# Patient Record
Sex: Female | Born: 1990 | Race: White | Hispanic: No | Smoking: Former smoker
Health system: Southern US, Community
[De-identification: ages and names within clinical notes are randomized; demographics above are authoritative.]

## PROBLEM LIST (undated history)

## (undated) DIAGNOSIS — J45909 Unspecified asthma, uncomplicated: Secondary | ICD-10-CM

## (undated) DIAGNOSIS — R87619 Unspecified abnormal cytological findings in specimens from cervix uteri: Secondary | ICD-10-CM

## (undated) DIAGNOSIS — IMO0002 Reserved for concepts with insufficient information to code with codable children: Secondary | ICD-10-CM

## (undated) DIAGNOSIS — R87629 Unspecified abnormal cytological findings in specimens from vagina: Secondary | ICD-10-CM

## (undated) DIAGNOSIS — B9689 Other specified bacterial agents as the cause of diseases classified elsewhere: Secondary | ICD-10-CM

## (undated) DIAGNOSIS — Z309 Encounter for contraceptive management, unspecified: Secondary | ICD-10-CM

## (undated) DIAGNOSIS — B009 Herpesviral infection, unspecified: Secondary | ICD-10-CM

## (undated) DIAGNOSIS — N760 Acute vaginitis: Secondary | ICD-10-CM

## (undated) HISTORY — DX: Acute vaginitis: N76.0

## (undated) HISTORY — DX: Reserved for concepts with insufficient information to code with codable children: IMO0002

## (undated) HISTORY — DX: Herpesviral infection, unspecified: B00.9

## (undated) HISTORY — DX: Unspecified abnormal cytological findings in specimens from vagina: R87.629

## (undated) HISTORY — DX: Unspecified abnormal cytological findings in specimens from cervix uteri: R87.619

## (undated) HISTORY — DX: Other specified bacterial agents as the cause of diseases classified elsewhere: B96.89

## (undated) HISTORY — DX: Encounter for contraceptive management, unspecified: Z30.9

---
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
## 2009-08-26 ENCOUNTER — Emergency Department (HOSPITAL_COMMUNITY): Admission: EM | Admit: 2009-08-26 | Discharge: 2009-08-26 | Payer: Self-pay | Admitting: Emergency Medicine

## 2010-04-23 LAB — BASIC METABOLIC PANEL
BUN: 14 mg/dL (ref 6–23)
CO2: 22 mEq/L (ref 19–32)
Calcium: 9.4 mg/dL (ref 8.4–10.5)
Chloride: 105 mEq/L (ref 96–112)
Creatinine, Ser: 0.77 mg/dL (ref 0.4–1.2)
GFR calc Af Amer: >60 mL/min (ref >60)
GFR calc non Af Amer: >60 mL/min (ref >60)
Glucose, Bld: 98 mg/dL (ref 70–99)
Potassium: 3.6 mEq/L (ref 3.5–5.1)
Sodium: 137 mEq/L (ref 135–145)

## 2010-04-23 LAB — CBC
Platelets: 302 10*3/uL (ref 150–400)
RDW: 16.8 % — ABNORMAL HIGH (ref 11.5–15.5)
WBC: 11.6 10*3/uL — ABNORMAL HIGH (ref 4.0–10.5)

## 2010-04-23 LAB — DIFFERENTIAL
Basophils Absolute: 0 10*3/uL (ref 0.0–0.1)
Lymphocytes Relative: 17 % (ref 12–46)
Neutro Abs: 8.9 10*3/uL — ABNORMAL HIGH (ref 1.7–7.7)

## 2010-04-23 LAB — POCT CARDIAC MARKERS
CKMB, poc: <1 ng/mL — ABNORMAL LOW (ref 1.0–8.0)
Troponin i, poc: <0.05 ng/mL (ref 0.00–0.09)

## 2010-07-29 ENCOUNTER — Other Ambulatory Visit (HOSPITAL_COMMUNITY): Payer: Self-pay | Admitting: Internal Medicine

## 2010-07-29 ENCOUNTER — Ambulatory Visit (HOSPITAL_COMMUNITY)
Admission: RE | Admit: 2010-07-29 | Discharge: 2010-07-29 | Disposition: A | Discharge status: Home / Self Care | Payer: 59 | Source: Ambulatory Visit | Attending: Internal Medicine | Admitting: Internal Medicine

## 2010-07-29 DIAGNOSIS — R52 Pain, unspecified: Secondary | ICD-10-CM

## 2010-07-29 DIAGNOSIS — W19XXXA Unspecified fall, initial encounter: Secondary | ICD-10-CM | POA: Insufficient documentation

## 2010-07-29 DIAGNOSIS — M25549 Pain in joints of unspecified hand: Secondary | ICD-10-CM | POA: Insufficient documentation

## 2010-07-29 DIAGNOSIS — S62329A Displaced fracture of shaft of unspecified metacarpal bone, initial encounter for closed fracture: Secondary | ICD-10-CM | POA: Insufficient documentation

## 2011-12-20 ENCOUNTER — Other Ambulatory Visit (HOSPITAL_COMMUNITY)
Admission: RE | Admit: 2011-12-20 | Discharge: 2011-12-20 | Disposition: A | Discharge status: Home / Self Care | Payer: 59 | Source: Ambulatory Visit | Attending: Obstetrics and Gynecology | Admitting: Obstetrics and Gynecology

## 2011-12-20 DIAGNOSIS — Z01419 Encounter for gynecological examination (general) (routine) without abnormal findings: Secondary | ICD-10-CM | POA: Insufficient documentation

## 2012-05-01 ENCOUNTER — Ambulatory Visit (INDEPENDENT_AMBULATORY_CARE_PROVIDER_SITE_OTHER): Payer: 59 | Admitting: Obstetrics & Gynecology

## 2012-05-01 ENCOUNTER — Encounter: Payer: Self-pay | Admitting: Obstetrics & Gynecology

## 2012-05-01 VITALS — BP 120/80 | Ht 62.0 in | Wt 159.0 lb

## 2012-05-01 DIAGNOSIS — N6019 Diffuse cystic mastopathy of unspecified breast: Secondary | ICD-10-CM

## 2012-05-01 DIAGNOSIS — N6011 Diffuse cystic mastopathy of right breast: Secondary | ICD-10-CM

## 2012-05-01 NOTE — Patient Instructions (Signed)
Fibrocystic Breast Changes  Fibrocystic breast changes is a non-cancerous(benign) condition that about half of all women have at some time in their life. It is also called benign breast disease and mammary dysplasia. It may also be called fibrocystic breast disease, but it is not really a disease. It is a common condition that occurs when women go through the hormonal changes during their menstrual cycle, between the ages of 20 to 50. Menopausal women do not have this problem, unless they are on hormone therapy. It can affect one or both breasts. This is not a sign that you will later get cancer.  CAUSES   Overgrowth of cells lining the milk ducts, or enlarged lobules in the breast, cause the breast duct to become blocked. The duct then fills up with fluid. This is like a small balloon filled with water. It is called a cyst. Over time, with repeated inflammation there is a tendency to form scar tissue. This scar tissue becomes the fibrous part of fibrocystic disease. The exact cause of this happening is not known, but it may be related to the female hormones, estrogen and progesterone. Heredity (genetics) may also be a factor in some cases.  SYMPTOMS   · Tenderness.  · Swelling.  · Rope-like feeling.  · Lumpy breast, one or both sides.  · Changes in the size of the breasts, before and after the menstrual period (larger before, smaller after).  · Green or dark brown nipple discharge (not blood).  Symptoms are usually worse before periods (menstrual cycle) and get better toward the end of menstruation. Usually, it is temporary minor discomfort. But some women have severe pain.   DIAGNOSIS   Check your breasts monthly. The best time to check your breasts is after your period. If you check them during your period, you are more likely to feel the normal glands enlarged, as a result of the hormonal changes that happen right before your period. If you do not have menstrual periods, check your breasts the first day of every  month. Become familiar with the way your own breasts feel. It is then easier to notice if there are changes, such as more tenderness, a new growth, change in breast size, or a change in a lump that has always been there. All breasts lumps need to be investigated, to rule out breast cancer. See your caregiver as soon as possible, if you find a lump. Most breast lumps are not cancerous. Excellent treatment is available for ones that are.   To make a diagnosis, your caregiver will examine your breasts and may recommend other tests, such as:  · Mammogram (breast X-ray).  · Ultrasound.  · MRI (magnetic resonance imaging).  · Removing fluid from the cyst with a fine needle, under local anesthesia (aspiration).  · Taking a breast tissue sample (breast biopsy).  Some questions your caregiver will ask are:  · What was the date of your last period?  · When did the lump show up?  · Is there any discharge from your breast?  · Is the breast tender or painful?  · Are the symptoms in one or both breasts?  · Has the lump changed in size from month-to-month? How long has it been present?  · Any family history of breast problems?  · Any past breast problems?  · Any history of breast surgery?  · Are you taking any medications?  · When was your last mammogram, and where was it done?  TREATMENT   ·   Dietary changes help to prevent or reduce the symptoms of fibrocystic breast changes.  · You may need to stop consuming all foods that contain caffeine, such as chocolate, sodas, coffee, and tea.  · Reducing sugar and fat in your diet may also help.  · Decrease estrogen in your diet. Some sources include commercially raised meats which contain estrogen. Eliminate other natural estrogens.  · Birth control pills can also make symptoms worse.  · Natural progesterone cream, applied at a dose of 15 to 20 milligrams per day, from ovulation until a day or two before your period returns, may help with returning to normal breast tissue over several  months. Seek advice from your caregiver.  · Over-the-counter pain pills may help, as recommended by your caregiver.  · Danazol hormone (female-like hormone) is sometimes used. It may cause hair growth and acne.  · Needle aspiration can be used, to remove fluid from the cyst.  · Surgery may be needed, to remove a large, persistent, and tender cyst.  · Evening primrose oil may help with the tenderness and pain. It has linolenic acid that women may not have enough of.  HOME CARE INSTRUCTIONS   · Examine your breasts after every menstrual period.  · If you do not have menstrual periods, examine your breasts the first day of every month.  · Wear a firm support bra, especially when exercising.  · Decrease or avoid caffeine in your diet.  · Decrease the fat and sugar in your diet.  · Eat a balanced diet.  · Try to see your caregiver after you have a menstrual period.  · Before seeing your caregiver, make notes about:  · When you have the symptoms.  · What types of symptoms you are having.  · Medications you are taking.  · When and where your last mammogram was taken.  · Past breast problems or breast surgery.  SEEK MEDICAL CARE IF:   · You have been diagnosed with fibrocystic breast changes, and you develop changes in your breast:  · Discharge from the nipple, especially bloody discharge.  · Pain in the breast that does not go away after your menstrual period.  · New lumps or bumps in the breast.  · Lumps in your armpit.  · Your breast or breasts become enlarged, red, and painful.  · You find an isolated lump, even if it is not tender.  · You have questions about this condition that have not been answered.  Document Released: 11/09/2005 Document Revised: 04/17/2011 Document Reviewed: 02/03/2009  ExitCare® Patient Information ©2013 [employer].

## 2012-05-01 NOTE — Progress Notes (Signed)
Patient ID: Laura Rosario, female   DOB: 01/24/1991, 22 y.o.   MRN: [id number]  Subjective:     Laura Rosario is an 22 y.o. female who presents for evaluation of a breast mass. Change was noted 1 month ago, and has been stable since first identified. Patient does routinely do self breast exams. The mass does not change during menstrual cycle. The mass is not tender. Patient denies nipple discharge. Breast cancer risk factors include: none.  The following portions of the patient's history were reviewed and updated as appropriate: allergies, current medications, past family history, past medical history, past social history, past surgical history and problem list.  Review of Systems Pertinent items are noted in HPI.     Objective:     Left breast is normal Right breast has area of fibrocystic changes 2 x 2 cm just above the right nipple, not fibroadenoma or cyst      Assessment:    fibrocystic changes    Plan:    Arranged for ultrasound.  Follow up in 1 month

## 2012-05-03 ENCOUNTER — Ambulatory Visit: Payer: Self-pay | Admitting: Obstetrics and Gynecology

## 2012-05-08 ENCOUNTER — Ambulatory Visit (HOSPITAL_COMMUNITY)
Admission: RE | Admit: 2012-05-08 | Discharge: 2012-05-08 | Disposition: A | Discharge status: Home / Self Care | Payer: 59 | Source: Ambulatory Visit | Attending: Obstetrics & Gynecology | Admitting: Obstetrics & Gynecology

## 2012-05-08 DIAGNOSIS — N6011 Diffuse cystic mastopathy of right breast: Secondary | ICD-10-CM

## 2012-05-08 DIAGNOSIS — N63 Unspecified lump in unspecified breast: Secondary | ICD-10-CM | POA: Insufficient documentation

## 2013-02-14 ENCOUNTER — Other Ambulatory Visit (HOSPITAL_COMMUNITY)
Admission: RE | Admit: 2013-02-14 | Discharge: 2013-02-14 | Disposition: A | Discharge status: Home / Self Care | Payer: 59 | Source: Ambulatory Visit | Attending: Obstetrics and Gynecology | Admitting: Obstetrics and Gynecology

## 2013-02-14 ENCOUNTER — Encounter (INDEPENDENT_AMBULATORY_CARE_PROVIDER_SITE_OTHER): Payer: Self-pay

## 2013-02-14 ENCOUNTER — Encounter: Payer: Self-pay | Admitting: Obstetrics and Gynecology

## 2013-02-14 ENCOUNTER — Ambulatory Visit (INDEPENDENT_AMBULATORY_CARE_PROVIDER_SITE_OTHER): Payer: 59 | Admitting: Obstetrics and Gynecology

## 2013-02-14 VITALS — BP 110/70 | Ht 62.0 in | Wt 165.0 lb

## 2013-02-14 DIAGNOSIS — Z1151 Encounter for screening for human papillomavirus (HPV): Secondary | ICD-10-CM | POA: Insufficient documentation

## 2013-02-14 DIAGNOSIS — Z01419 Encounter for gynecological examination (general) (routine) without abnormal findings: Secondary | ICD-10-CM

## 2013-02-14 NOTE — Addendum Note (Signed)
Addended by: Richardson ChiquitoRAVIS, Aranza Geddes M on: 02/14/2013 10:08 AM   Modules accepted: Orders

## 2013-02-14 NOTE — Progress Notes (Signed)
Patient ID: Laura Rosario, female   DOB: Oct 11, 1990, 23 y.o.   MRN: [id number] Pt here today for annual exam. Pt states that she has had painful intercourse the past few times and had some terrible cramping a few days ago.    Assessment:  Annual Gyn Exam Dyspareunia due to ut contact   Plan:  1. pap smear done, next pap due 2185yr 2. return annually or prn 3    Annual mammogram at 38 Subjective:  Laura Rosario is a 23 y.o. female G0P0 who presents for annual exam. Patient's last menstrual period was 01/30/2013. The patient has complaints today of none x dyspareunia. Hx HSV-II uses condoms, considering other contr management options  The following portions of the patient's history were reviewed and updated as appropriate: allergies, current medications, past family history, past medical history, past social history, past surgical history and problem list. Pt is ADOPTED Review of Systems Constitutional: negative Gastrointestinal: negative Genitourinary: reg cycles, see hpi  Objective:  BP 110/70  Ht 5\' 2"  (1.575 m)  Wt 165 lb (74.844 kg)  BMI 30.17 kg/m2  LMP 01/30/2013   BMI: Body mass index is 30.17 kg/(m^2).  General Appearance: Alert, appropriate appearance for age. No acute distress HEENT: Grossly normal Neck / Thyroid:  Cardiovascular: RRR; normal S1, S2, no murmur Lungs: CTA bilaterally Back: No CVAT Breast Exam: No dimpling, nipple retraction or discharge. No masses or nodes., Normal to inspection, Normal breast tissue bilaterally,  No masses or nodes.No dimpling, nipple retraction or discharge. Gastrointestinal: Soft, non-tender, no masses or organomegaly Pelvic Exam: Vulva and vagina appear normal. Bimanual exam reveals normal uterus and adnexa. Clinical staff offered to be present for exam: yes  Initials: AMT Rectovaginal: not indicated Lymphatic Exam: Non-palpable nodes in neck, clavicular, axillary, or inguinal regions Skin: no rash or abnormalities Neurologic:  Normal gait and speech, no tremor  Psychiatric: Alert and oriented, appropriate affect.  Urinalysis:Not done  Christin BachJohn Ferguson. MD Pgr [phone] 9:27 AM

## 2013-04-23 ENCOUNTER — Ambulatory Visit (INDEPENDENT_AMBULATORY_CARE_PROVIDER_SITE_OTHER): Payer: 59 | Admitting: Obstetrics & Gynecology

## 2013-04-23 ENCOUNTER — Encounter: Payer: Self-pay | Admitting: Obstetrics & Gynecology

## 2013-04-23 ENCOUNTER — Encounter (INDEPENDENT_AMBULATORY_CARE_PROVIDER_SITE_OTHER): Payer: Self-pay

## 2013-04-23 VITALS — BP 124/72 | Ht 62.0 in | Wt 158.0 lb

## 2013-04-23 DIAGNOSIS — N760 Acute vaginitis: Secondary | ICD-10-CM

## 2013-04-23 MED ORDER — METRONIDAZOLE 0.75 % VA GEL: VAGINAL | Status: DC

## 2013-04-23 NOTE — Progress Notes (Signed)
Patient ID: Laura Rosario, female   DOB: Dec 13, 1990, 23 y.o.   MRN: [id number] Pt with odor and discharge never had before  Wet prep  +BV No trich or yeast  Reviewed cuses and control of BV  rx metrogel

## 2013-08-22 ENCOUNTER — Ambulatory Visit (INDEPENDENT_AMBULATORY_CARE_PROVIDER_SITE_OTHER): Payer: 59 | Admitting: Adult Health

## 2013-08-22 ENCOUNTER — Encounter: Payer: Self-pay | Admitting: Adult Health

## 2013-08-22 VITALS — BP 110/70 | Ht 62.0 in | Wt 160.0 lb

## 2013-08-22 DIAGNOSIS — Z30011 Encounter for initial prescription of contraceptive pills: Secondary | ICD-10-CM

## 2013-08-22 DIAGNOSIS — Z309 Encounter for contraceptive management, unspecified: Secondary | ICD-10-CM

## 2013-08-22 DIAGNOSIS — Z3202 Encounter for pregnancy test, result negative: Secondary | ICD-10-CM

## 2013-08-22 DIAGNOSIS — Z3009 Encounter for other general counseling and advice on contraception: Secondary | ICD-10-CM

## 2013-08-22 HISTORY — DX: Encounter for contraceptive management, unspecified: Z30.9

## 2013-08-22 LAB — POCT URINE PREGNANCY: PREG TEST UR: NEGATIVE

## 2013-08-22 MED ORDER — NORETHIN ACE-ETH ESTRAD-FE 1-20 MG-MCG(24) PO TABS: 1.0000 | ORAL_TABLET | Freq: Every day | ORAL | Status: DC

## 2013-08-22 NOTE — Progress Notes (Addendum)
Subjective:     Patient ID: Laura Rosario, female   DOB: 02/09/1990, 23 y.o.   MRN: [id number]  HPI Laura Rosario is a 23 year old white female in to get Rx for OCs, has been taking Loestrin 24 fe that she had at home, but they were expired.No complaints, had normal pap 02/2013.  Review of Systems See HPI Reviewed past medical,surgical, social and family history. Reviewed medications and allergies.     Objective:   Physical Exam BP 110/70  Ht 5\' 2"  (1.575 m)  Wt 160 lb (72.576 kg)  BMI 29.26 kg/m2  LMP 06/18/2015UPT negative, Skin warm and dry. Neck: mid line trachea, normal thyroid. Lungs: clear to ausculation bilaterally. Cardiovascular: regular rate and rhythm.    Assessment:     Contraceptive management    Plan:    Use condoms Rx Loestrin 24 fe disp 1 pack take 1 daily with 11 refills Follow up prn

## 2013-08-22 NOTE — Patient Instructions (Signed)
Take OCs daily Use condoms Oral Contraception Use Oral contraceptive pills (OCPs) are medicines taken to prevent pregnancy. OCPs work by preventing the ovaries from releasing eggs. The hormones in OCPs also cause the cervical mucus to thicken, preventing the sperm from entering the uterus. The hormones also cause the uterine lining to become thin, not allowing a fertilized egg to attach to the inside of the uterus. OCPs are highly effective when taken exactly as prescribed. However, OCPs do not prevent sexually transmitted diseases (STDs). Safe sex practices, such as using condoms along with an OCP, can help prevent STDs. Before taking OCPs, you may have a physical exam and Pap test. Your health care provider may also order blood tests if necessary. Your health care provider will make sure you are a good candidate for oral contraception. Discuss with your health care provider the possible side effects of the OCP you may be prescribed. When starting an OCP, it can take 2 to 3 months for the body to adjust to the changes in hormone levels in your body.  HOW TO TAKE ORAL CONTRACEPTIVE PILLS Your health care provider may advise you on how to start taking the first cycle of OCPs. Otherwise, you can:   Start on day 1 of your menstrual period. You will not need any backup contraceptive protection with this start time.   Start on the first Sunday after your menstrual period or the day you get your prescription. In these cases, you will need to use backup contraceptive protection for the first week.   Start the pill at any time of your cycle. If you take the pill within 5 days of the start of your period, you are protected against pregnancy right away. In this case, you will not need a backup form of birth control. If you start at any other time of your menstrual cycle, you will need to use another form of birth control for 7 days. If your OCP is the type called a minipill, it will protect you from pregnancy  after taking it for 2 days (48 hours). After you have started taking OCPs:   If you forget to take 1 pill, take it as soon as you remember. Take the next pill at the regular time.   If you miss 2 or more pills, call your health care provider because different pills have different instructions for missed doses. Use backup birth control until your next menstrual period starts.   If you use a 28-day pack that contains inactive pills and you miss 1 of the last 7 pills (pills with no hormones), it will not matter. Throw away the rest of the nonhormone pills and start a new pill pack.  No matter which day you start the OCP, you will always start a new pack on that same day of the week. Have an extra pack of OCPs and a backup contraceptive method available in case you miss some pills or lose your OCP pack.  HOME CARE INSTRUCTIONS   Do not smoke.   Always use a condom to protect against STDs. OCPs do not protect against STDs.   Use a calendar to mark your menstrual period days.   Read the information and directions that came with your OCP. Talk to your health care provider if you have questions.  SEEK MEDICAL CARE IF:   You develop nausea and vomiting.   You have abnormal vaginal discharge or bleeding.   You develop a rash.   You miss your menstrual period.  You are losing your hair.   You need treatment for mood swings or depression.   You get dizzy when taking the OCP.   You develop acne from taking the OCP.   You become pregnant.  SEEK IMMEDIATE MEDICAL CARE IF:   You develop chest pain.   You develop shortness of breath.   You have an uncontrolled or severe headache.   You develop numbness or slurred speech.   You develop visual problems.   You develop pain, redness, and swelling in the legs.  Document Released: 01/12/2011 Document Revised: 09/25/2012 Document Reviewed: 07/14/2012 [facility] Patient Information 2015 [employer], [place]. This information  is not intended to replace advice given to you by your health care provider. Make sure you discuss any questions you have with your health care provider.

## 2014-01-13 ENCOUNTER — Encounter: Payer: Self-pay | Admitting: *Deleted

## 2014-01-13 ENCOUNTER — Emergency Department
Admission: EM | Admit: 2014-01-13 | Discharge: 2014-01-13 | Disposition: A | Discharge status: Home / Self Care | Payer: 59 | Source: Home / Self Care

## 2014-01-13 DIAGNOSIS — J029 Acute pharyngitis, unspecified: Secondary | ICD-10-CM

## 2014-01-13 HISTORY — DX: Unspecified asthma, uncomplicated: J45.909

## 2014-01-13 LAB — POCT RAPID STREP A (OFFICE): RAPID STREP A SCREEN: NEGATIVE

## 2014-01-13 MED ORDER — AMOXICILLIN 875 MG PO TABS: 875.0000 mg | ORAL_TABLET | Freq: Two times a day (BID) | ORAL | Status: DC

## 2014-01-13 NOTE — Discharge Instructions (Signed)

## 2014-01-13 NOTE — ED Notes (Signed)
Pt c/o sore throat, white spots on her throat and body aches x 0200 today. Denies fever.

## 2014-01-13 NOTE — ED Provider Notes (Signed)
CSN: [id number]     Arrival date & time 01/13/14  [id number] History   None    Chief Complaint  Patient presents with  . Sore Throat   (Consider location/radiation/quality/duration/timing/severity/associated sxs/prior Treatment) HPI  Pt presents to the clinic with ST that started this morning at 2am. 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 She has had URI for 2 weeks preceding but had cleared and felt fine. She only has ST today. She has not taken or done anything to make better. She did notice on white spot on left side of throat. No fever, chills, sinus pressure, ear pain, cough, SOB or wheezing.   Past Medical History  Diagnosis Date  . Herpes simplex without mention of complication   . Abnormal Pap smear     HPV  . Vaginal Pap smear, abnormal   . BV (bacterial vaginosis)   . Contraceptive management 08/22/2013  . Asthma    History reviewed. No pertinent past surgical history. Family History  Problem Relation Age of Onset  . Cancer Father     Lung  . Hypertension Father   . Iron deficiency Mother   . Thyroid disease Mother   . Diabetes Paternal Grandmother   . Heart attack Maternal Grandfather   . Diabetes Paternal Grandfather    History  Substance Use Topics  . Smoking status: Former Smoker    Types: Cigarettes    Quit date: 05/02/2010  . Smokeless tobacco: Never Used  . Alcohol Use: 0.6 oz/week    1 Glasses of wine per week     Comment: ocassional 2time a month   OB History    Gravida Para Term Preterm AB TAB SAB Ectopic Multiple Living   0              Review of Systems  All other systems reviewed and are negative.   Allergies  Review of patient's allergies indicates no known allergies.  Home Medications   Prior to Admission medications   Medication Sig Start Date End Date Taking? Authorizing Provider  amoxicillin (AMOXIL) 875 MG tablet Take 1 tablet (875 mg total) by mouth 2 (two) times daily. For 10 days. 01/13/14   Latham Kinzler L Antwann Preziosi, PA-C   Norethindrone Acetate-Ethinyl Estrad-FE (LOESTRIN 24 FE) 1-20 MG-MCG(24) tablet Take 1 tablet by mouth daily. 08/22/13   Adline PotterJennifer A Griffin, NP  valACYclovir (VALTREX) 500 MG tablet Take 500 mg by mouth daily.    Historical Provider, MD   BP 127/86 mmHg  Pulse 81  Temp(Src) 98.2 F (36.8 C) (Oral)  Resp 18  Ht 5\' 2"  (1.575 m)  Wt 158 lb (71.668 kg)  BMI 28.89 kg/m2  SpO2 99%  LMP 01/10/2014 Physical Exam  Constitutional: She is oriented to person, place, and time. She appears well-developed and well-nourished.  HENT:  Head: Normocephalic and atraumatic.  Right Ear: External ear normal.  Left Ear: External ear normal.  Nose: Nose normal.  Mouth/Throat: No oropharyngeal exudate.  TM's clear bilaterally.   Oropharynx erythematous with mild left tonsilar swelling. One white spot on left tonsil.   Negative for any maxillary tenderness.   Eyes: Conjunctivae are normal. Right eye exhibits no discharge. Left eye exhibits no discharge.  Neck: Normal range of motion. Neck supple.  Slightly tender left sided only lymphadenopathy anterior cervical nodes.   Cardiovascular: Normal rate, regular rhythm and normal heart sounds.   Pulmonary/Chest: Effort normal and breath sounds normal. She has no wheezes.  Neurological: She is alert and oriented to person,  place, and time.  Skin: Skin is dry.  Psychiatric: She has a normal mood and affect. Her behavior is normal.    ED Course  Procedures (including critical care time) Labs Review Labs Reviewed  POCT RAPID STREP A (OFFICE)    Imaging Review No results found.   MDM   1. Acute pharyngitis, unspecified pharyngitis type    Rapid Strep negative.  Will culture.  Discussed likey viral. Since has not tried anything to make better suggested ibuprofen, salt water gargles, honey and tea.  If not improving or spike a fever given printed amoxil to start. Discussed to give at least 24-48 hours to start abx.  HO given.     Jomarie LongsJade L  Julis Haubner, PA-C 01/13/14 [phone]

## 2014-01-16 ENCOUNTER — Telehealth: Payer: Self-pay | Admitting: Emergency Medicine

## 2014-01-16 NOTE — ED Notes (Signed)
Inquired about patient's status; encourage them to call with questions/concerns.  

## 2014-06-14 ENCOUNTER — Emergency Department (HOSPITAL_COMMUNITY)
Admission: EM | Admit: 2014-06-14 | Discharge: 2014-06-14 | Disposition: A | Discharge status: Home / Self Care | Payer: 59 | Attending: Emergency Medicine | Admitting: Emergency Medicine

## 2014-06-14 ENCOUNTER — Encounter (HOSPITAL_COMMUNITY): Payer: Self-pay | Admitting: Emergency Medicine

## 2014-06-14 ENCOUNTER — Emergency Department (HOSPITAL_COMMUNITY): Payer: 59

## 2014-06-14 DIAGNOSIS — Z3202 Encounter for pregnancy test, result negative: Secondary | ICD-10-CM | POA: Insufficient documentation

## 2014-06-14 DIAGNOSIS — R079 Chest pain, unspecified: Secondary | ICD-10-CM | POA: Insufficient documentation

## 2014-06-14 DIAGNOSIS — J45909 Unspecified asthma, uncomplicated: Secondary | ICD-10-CM | POA: Insufficient documentation

## 2014-06-14 DIAGNOSIS — Z793 Long term (current) use of hormonal contraceptives: Secondary | ICD-10-CM | POA: Diagnosis not present

## 2014-06-14 DIAGNOSIS — Z792 Long term (current) use of antibiotics: Secondary | ICD-10-CM | POA: Diagnosis not present

## 2014-06-14 DIAGNOSIS — Z8742 Personal history of other diseases of the female genital tract: Secondary | ICD-10-CM | POA: Diagnosis not present

## 2014-06-14 DIAGNOSIS — Z8619 Personal history of other infectious and parasitic diseases: Secondary | ICD-10-CM | POA: Diagnosis not present

## 2014-06-14 DIAGNOSIS — Z79899 Other long term (current) drug therapy: Secondary | ICD-10-CM | POA: Insufficient documentation

## 2014-06-14 DIAGNOSIS — Z87891 Personal history of nicotine dependence: Secondary | ICD-10-CM | POA: Insufficient documentation

## 2014-06-14 LAB — CBC WITH DIFFERENTIAL/PLATELET
BASOS PCT: 0 % (ref 0–1)
Basophils Absolute: 0 10*3/uL (ref 0.0–0.1)
Eosinophils Absolute: 0.3 10*3/uL (ref 0.0–0.7)
Eosinophils Relative: 3 % (ref 0–5)
HCT: 39.7 % (ref 36.0–46.0)
HEMOGLOBIN: 13.3 g/dL (ref 12.0–15.0)
LYMPHS ABS: 3.3 10*3/uL (ref 0.7–4.0)
Lymphocytes Relative: 35 % (ref 12–46)
MCH: 32 pg (ref 26.0–34.0)
MCHC: 33.5 g/dL (ref 30.0–36.0)
MCV: 95.7 fL (ref 78.0–100.0)
MONO ABS: 0.9 10*3/uL (ref 0.1–1.0)
MONOS PCT: 10 % (ref 3–12)
NEUTROS ABS: 4.9 10*3/uL (ref 1.7–7.7)
NEUTROS PCT: 52 % (ref 43–77)
Platelets: 255 10*3/uL (ref 150–400)
RBC: 4.15 MIL/uL (ref 3.87–5.11)
RDW: 13.1 % (ref 11.5–15.5)
WBC: 9.3 10*3/uL (ref 4.0–10.5)

## 2014-06-14 LAB — COMPREHENSIVE METABOLIC PANEL
ALT: 15 U/L (ref 14–54)
AST: 19 U/L (ref 15–41)
Albumin: 4 g/dL (ref 3.5–5.0)
Alkaline Phosphatase: 63 U/L (ref 38–126)
Anion gap: 7 (ref 5–15)
BUN: 14 mg/dL (ref 6–20)
CALCIUM: 8.8 mg/dL — AB (ref 8.9–10.3)
CHLORIDE: 107 mmol/L (ref 101–111)
CO2: 26 mmol/L (ref 22–32)
Creatinine, Ser: 0.75 mg/dL (ref 0.44–1.00)
GFR calc Af Amer: >60 mL/min (ref >60)
Glucose, Bld: 103 mg/dL — ABNORMAL HIGH (ref 70–99)
Potassium: 3.5 mmol/L (ref 3.5–5.1)
Sodium: 140 mmol/L (ref 135–145)
Total Bilirubin: 0.3 mg/dL (ref 0.3–1.2)
Total Protein: 7.4 g/dL (ref 6.5–8.1)

## 2014-06-14 LAB — URINALYSIS, ROUTINE W REFLEX MICROSCOPIC
Bilirubin Urine: NEGATIVE
Glucose, UA: NEGATIVE mg/dL
Hgb urine dipstick: NEGATIVE
Ketones, ur: NEGATIVE mg/dL
LEUKOCYTES UA: NEGATIVE
NITRITE: NEGATIVE
PH: 6 (ref 5.0–8.0)
Protein, ur: NEGATIVE mg/dL
SPECIFIC GRAVITY, URINE: 1.02 (ref 1.005–1.030)
Urobilinogen, UA: 0.2 mg/dL (ref 0.0–1.0)

## 2014-06-14 LAB — POC URINE PREG, ED: PREG TEST UR: NEGATIVE

## 2014-06-14 LAB — TROPONIN I: Troponin I: <0.03 ng/mL (ref <0.031)

## 2014-06-14 LAB — LIPASE, BLOOD: Lipase: 23 U/L (ref 22–51)

## 2014-06-14 NOTE — ED Provider Notes (Signed)
CSN: [id number]     Arrival date & time 06/14/14  1445 History   First MD Initiated Contact with Patient 06/14/14 1507     Chief Complaint  Patient presents with  . Chest Pain     (Consider location/radiation/quality/duration/timing/severity/associated sxs/prior Treatment) HPI.... Anterior chest pain radiating to the right breast, right shoulder, right neck since 8 AM this morning. No dizziness, nausea, dyspnea, diaphoresis. Pain is worse with expiration. No cigarette smoking. No prolonged travel or immobilization. Severity is mild. She is taking nothing at home for pain.  Past Medical History  Diagnosis Date  . Herpes simplex without mention of complication   . Abnormal Pap smear     HPV  . Vaginal Pap smear, abnormal   . BV (bacterial vaginosis)   . Contraceptive management 08/22/2013  . Asthma    History reviewed. No pertinent past surgical history. Family History  Problem Relation Age of Onset  . Cancer Father     Lung  . Hypertension Father   . Iron deficiency Mother   . Thyroid disease Mother   . Diabetes Paternal Grandmother   . Heart attack Maternal Grandfather   . Diabetes Paternal Grandfather    History  Substance Use Topics  . Smoking status: Former Smoker -- 0.02 packs/day for 2 years    Types: Cigarettes    Quit date: 05/02/2010  . Smokeless tobacco: Never Used  . Alcohol Use: 0.6 oz/week    1 Glasses of wine per week     Comment: ocassional 2time a month   OB History    Gravida Para Term Preterm AB TAB SAB Ectopic Multiple Living   0              Review of Systems  All other systems reviewed and are negative.     Allergies  Review of patient's allergies indicates no known allergies.  Home Medications   Prior to Admission medications   Medication Sig Start Date End Date Taking? Authorizing Provider  albuterol (PROVENTIL HFA;VENTOLIN HFA) 108 (90 BASE) MCG/ACT inhaler Inhale 2 puffs into the lungs every 6 (six) hours as needed for wheezing or  shortness of breath.   Yes Historical Provider, MD  Multiple Vitamin (MULTIVITAMIN WITH MINERALS) TABS tablet Take 1 tablet by mouth daily.   Yes Historical Provider, MD  PRESCRIPTION MEDICATION Take 1 tablet by mouth at bedtime. Birth Control   Yes Historical Provider, MD  amoxicillin (AMOXIL) 875 MG tablet Take 1 tablet (875 mg total) by mouth 2 (two) times daily. For 10 days. Patient not taking: Reported on 06/14/2014 01/13/14   Jomarie LongsJade L Breeback, PA-C  Norethindrone Acetate-Ethinyl Estrad-FE (LOESTRIN 24 FE) 1-20 MG-MCG(24) tablet Take 1 tablet by mouth daily. Patient not taking: Reported on 06/14/2014 08/22/13   Adline PotterJennifer A Griffin, NP   BP 114/84 mmHg  Pulse 72  Temp(Src) 98.7 F (37.1 C) (Oral)  Resp 14  Ht 5\' 2"  (1.575 m)  Wt 158 lb (71.668 kg)  BMI 28.89 kg/m2  SpO2 100%  LMP 05/31/2014 Physical Exam  Constitutional: She is oriented to person, place, and time. She appears well-developed and well-nourished.  HENT:  Head: Normocephalic and atraumatic.  Eyes: Conjunctivae and EOM are normal. Pupils are equal, round, and reactive to light.  Neck: Normal range of motion. Neck supple.  Cardiovascular: Normal rate and regular rhythm.   Pulmonary/Chest: Effort normal and breath sounds normal.  Abdominal: Soft. Bowel sounds are normal.  Musculoskeletal: Normal range of motion.  Neurological: She is alert and oriented  to person, place, and time.  Skin: Skin is warm and dry.  Psychiatric: She has a normal mood and affect. Her behavior is normal.  Nursing note and vitals reviewed.   ED Course  Procedures (including critical care time) Labs Review Labs Reviewed  COMPREHENSIVE METABOLIC PANEL - Abnormal; Notable for the following:    Glucose, Bld 103 (*)    Calcium 8.8 (*)    All other components within normal limits  URINALYSIS, ROUTINE W REFLEX MICROSCOPIC - Abnormal; Notable for the following:    APPearance HAZY (*)    All other components within normal limits  CBC WITH  DIFFERENTIAL/PLATELET  TROPONIN I  LIPASE, BLOOD  POC URINE PREG, ED    Imaging Review Dg Chest 2 View  06/14/2014   CLINICAL DATA:  Chest pain today.  Pain on expiration.  EXAM: CHEST  2 VIEW  COMPARISON:  01/13/2006  FINDINGS: The heart size and mediastinal contours are within normal limits. Both lungs are clear. No pleural effusion or pneumothorax. The visualized skeletal structures are unremarkable.  IMPRESSION: Normal chest radiographs.   Electronically Signed   By: Amie Portlandavid  Ormond M.D.   On: 06/14/2014 15:29     EKG Interpretation   Date/Time:  Sunday Jun 14 2014 14:52:54 EDT Ventricular Rate:  85 PR Interval:  122 QRS Duration: 78 QT Interval:  374 QTC Calculation: 445 R Axis:   59 Text Interpretation:  Normal sinus rhythm Normal ECG Confirmed by Fayrene FearingJAMES   MD, MARK ([id number]) on 06/14/2014 3:01:28 PM Also confirmed by Adriana SimasOOK  MD, Akhilesh Sassone  [phone]([id number])  on 06/14/2014 5:48:52 PM      MDM   Final diagnoses:  Chest pain, unspecified chest pain type    Normal physical exam. Patient is low risk for ACS or PE. Screening tests including labs, troponin, EKG, chest x-ray on normal.    Donnetta HutchingBrian Joanann Mies, MD 06/14/14 [id number]

## 2014-06-14 NOTE — ED Notes (Signed)
Patient c/o right side chest pain that radiates into neck, right shoulder, and arm. Denies any shortness of breath but reports increase in pain with deep breath. Per patient pain started this morning and is progressively getting worse.

## 2014-06-14 NOTE — Discharge Instructions (Signed)
Chest Pain (Nonspecific) It is often hard to give a diagnosis for the cause of chest pain. There is always a chance that your pain could be related to something serious, such as a heart attack or a blood clot in the lungs. You need to follow up with your doctor. HOME CARE  If antibiotic medicine was given, take it as directed by your doctor. Finish the medicine even if you start to feel better.  For the next few days, avoid activities that bring on chest pain. Continue physical activities as told by your doctor.  Do not use any tobacco products. This includes cigarettes, chewing tobacco, and e-cigarettes.  Avoid drinking alcohol.  Only take medicine as told by your doctor.  Follow your doctor's suggestions for more testing if your chest pain does not go away.  Keep all doctor visits you made. GET HELP IF:  Your chest pain does not go away, even after treatment.  You have a rash with blisters on your chest.  You have a fever. GET HELP RIGHT AWAY IF:   You have more pain or pain that spreads to your arm, neck, jaw, back, or belly (abdomen).  You have shortness of breath.  You cough more than usual or cough up blood.  You have very bad back or belly pain.  You feel sick to your stomach (nauseous) or throw up (vomit).  You have very bad weakness.  You pass out (faint).  You have chills. This is an emergency. Do not wait to see if the problems will go away. Call your local emergency services (911 in [place] Do not drive yourself to the hospital. MAKE SURE YOU:   Understand these instructions.  Will watch your condition.  Will get help right away if you are not doing well or get worse. Document Released: 07/12/2007 Document Revised: 01/28/2013 Document Reviewed: 07/12/2007 [facility] Patient Information 2015 [employer], [place]. This information is not intended to replace advice given to you by your health care provider. Make sure you discuss any questions you have with your  health care provider.  Tests were normal. Tylenol or ibuprofen for pain. Follow-up your doctor.

## 2014-08-21 ENCOUNTER — Other Ambulatory Visit: Payer: Self-pay | Admitting: Adult Health

## 2016-03-17 IMAGING — DX DG CHEST 2V
2 series · 2 of 2 positions shown · non-contrast
Comparison: 01/13/2006

CLINICAL DATA: Chest pain today.  Pain on expiration.

EXAM:
CHEST  2 VIEW

[chest pa]
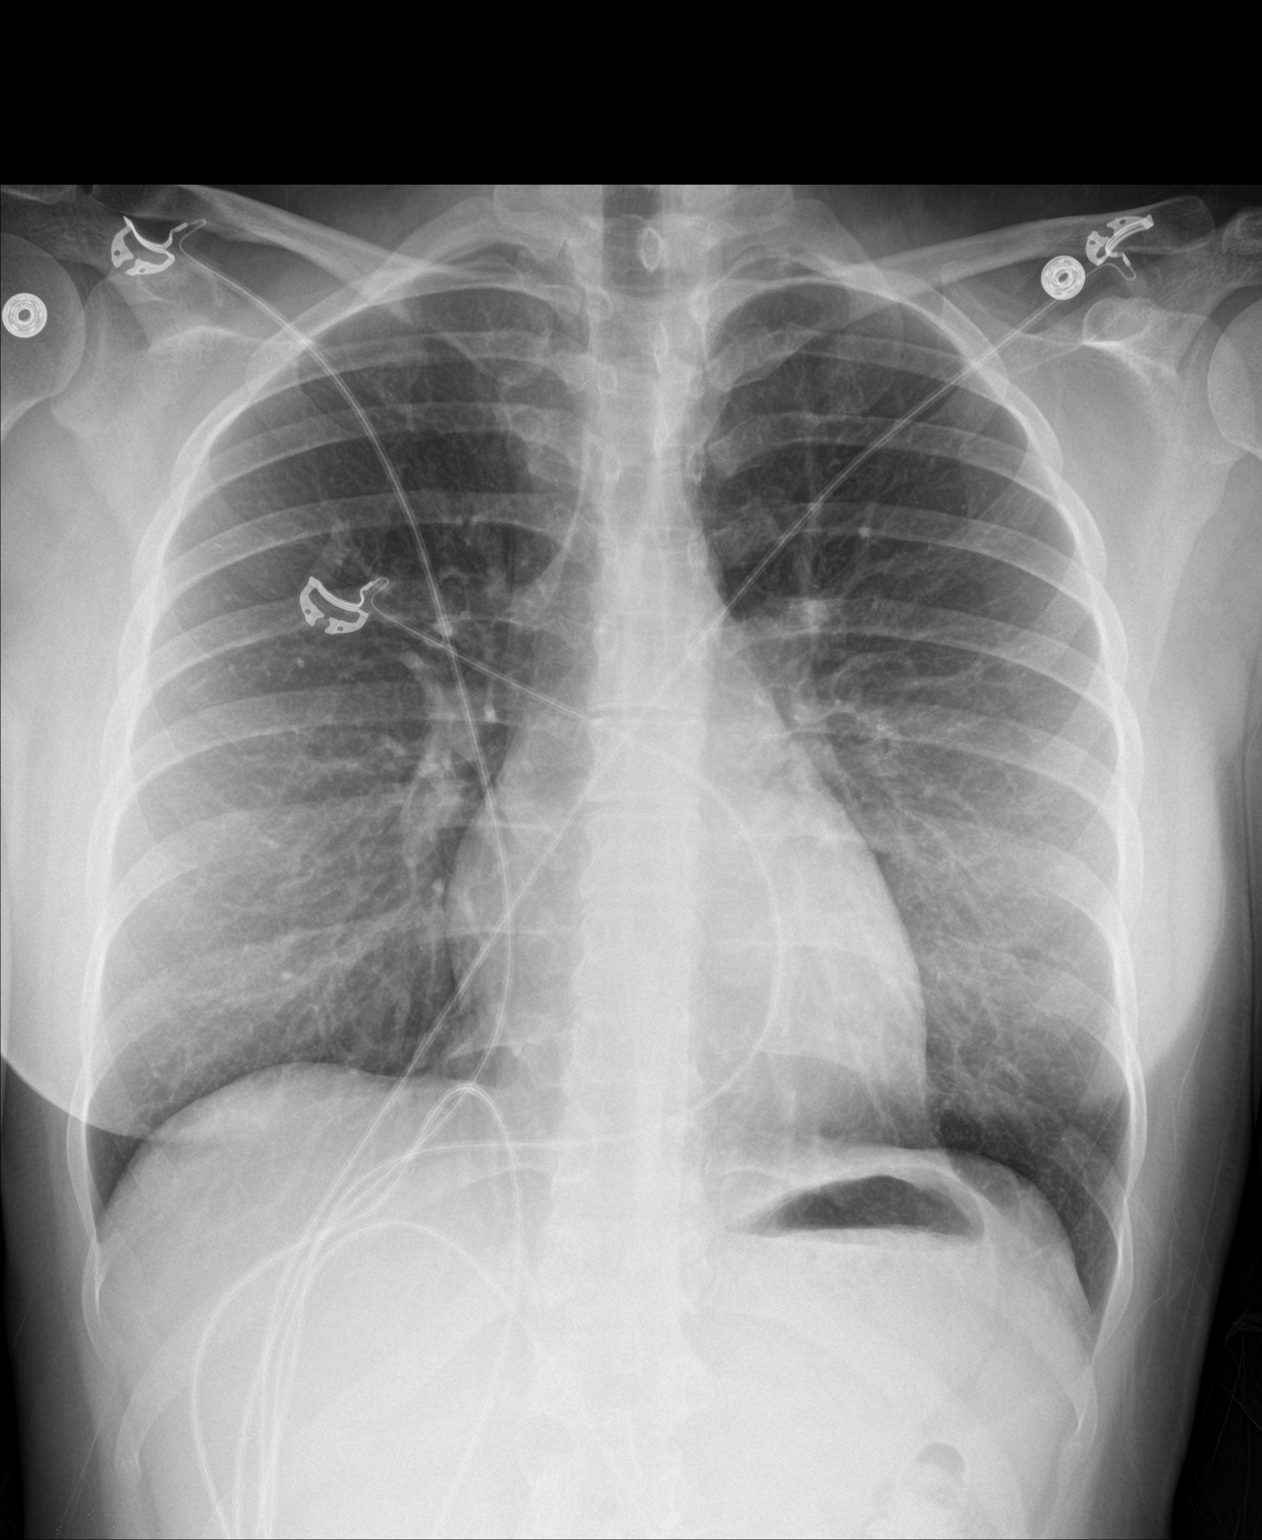

[chest lat]
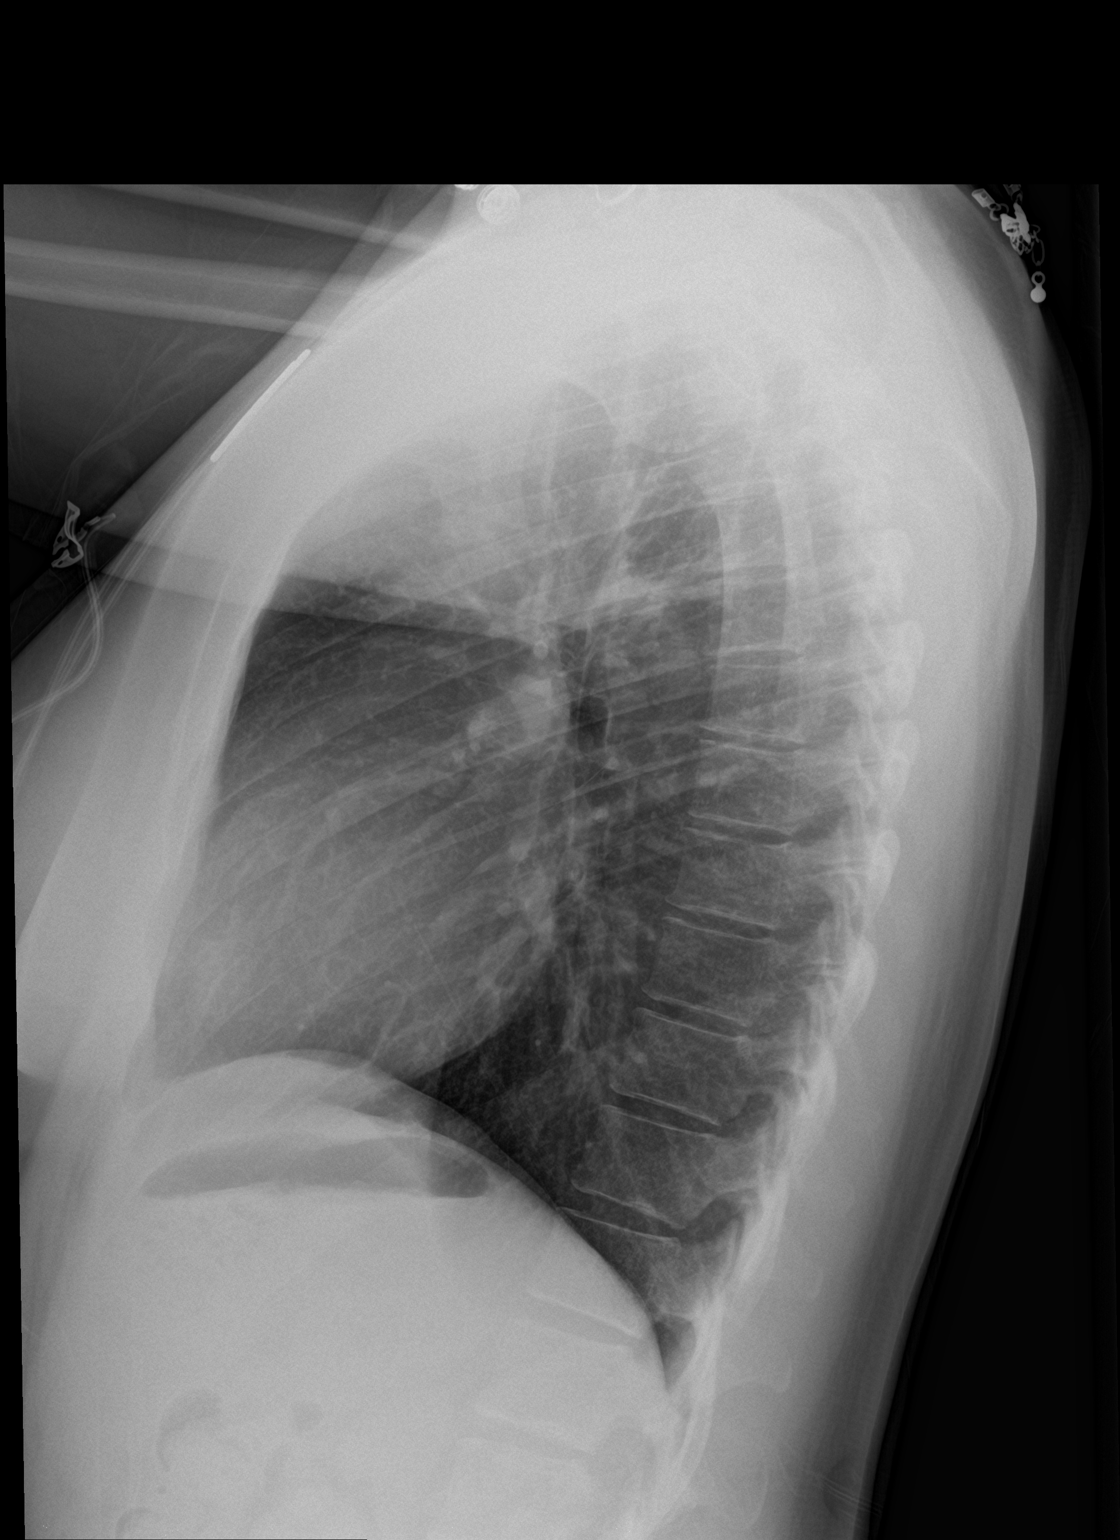

[2 of 2 positions shown; findings below may reference images not displayed]

FINDINGS: The heart size and mediastinal contours are within normal limits.
Both lungs are clear. No pleural effusion or pneumothorax. The
visualized skeletal structures are unremarkable.
IMPRESSION: Normal chest radiographs.

## 2019-02-11 ENCOUNTER — Ambulatory Visit: Payer: Self-pay | Attending: Internal Medicine

## 2019-02-12 ENCOUNTER — Ambulatory Visit: Payer: Self-pay | Attending: Internal Medicine

## 2019-02-12 ENCOUNTER — Other Ambulatory Visit: Payer: Self-pay

## 2019-02-12 DIAGNOSIS — Z20822 Contact with and (suspected) exposure to covid-19: Secondary | ICD-10-CM | POA: Insufficient documentation

## 2019-02-13 LAB — NOVEL CORONAVIRUS, NAA: SARS-CoV-2, NAA: NOT DETECTED

## 2019-09-29 ENCOUNTER — Encounter: Payer: Self-pay | Admitting: Obstetrics & Gynecology

## 2019-09-29 ENCOUNTER — Other Ambulatory Visit (HOSPITAL_COMMUNITY)
Admission: RE | Admit: 2019-09-29 | Discharge: 2019-09-29 | Disposition: A | Discharge status: Home / Self Care | Payer: PRIVATE HEALTH INSURANCE | Source: Ambulatory Visit | Attending: Obstetrics & Gynecology | Admitting: Obstetrics & Gynecology

## 2019-09-29 ENCOUNTER — Ambulatory Visit (INDEPENDENT_AMBULATORY_CARE_PROVIDER_SITE_OTHER): Payer: PRIVATE HEALTH INSURANCE | Admitting: Obstetrics & Gynecology

## 2019-09-29 VITALS — BP 118/81 | HR 68 | Ht 62.0 in | Wt 184.0 lb

## 2019-09-29 DIAGNOSIS — Z01419 Encounter for gynecological examination (general) (routine) without abnormal findings: Secondary | ICD-10-CM | POA: Insufficient documentation

## 2019-09-29 DIAGNOSIS — E038 Other specified hypothyroidism: Secondary | ICD-10-CM

## 2019-09-29 MED ORDER — METRONIDAZOLE 0.75 % VA GEL: VAGINAL | 5 refills | Status: DC

## 2019-09-29 NOTE — Progress Notes (Signed)
Subjective:     Laura Rosario is a 29 y.o. female here for a routine exam.  Patient's last menstrual period was 09/07/2019 (exact date). G1P1001 Birth Control Method:  none Menstrual Calendar(currently): regular  Current complaints: none.   Current acute medical issues:  none   Recent Gynecologic History Patient's last menstrual period was 09/07/2019 (exact date). Last Pap: 2019,  normal Last mammogram: n/a,    Past Medical History:  Diagnosis Date   Abnormal Pap smear    HPV   Asthma    BV (bacterial vaginosis)    Contraceptive management 08/22/2013   Herpes simplex without mention of complication    Vaginal Pap smear, abnormal     History reviewed. No pertinent surgical history.  OB History    Gravida  1   Para  1   Term  1   Preterm      AB      Living  1     SAB      TAB      Ectopic      Multiple      Live Births              Social History   Socioeconomic History   Marital status: Single    Spouse name: Not on file   Number of children: Not on file   Years of education: Not on file   Highest education level: Not on file  Occupational History   Not on file  Tobacco Use   Smoking status: Former Smoker    Packs/day: 0.02    Years: 2.00    Pack years: 0.04    Types: Cigarettes    Quit date: 05/02/2010    Years since quitting: 9.4   Smokeless tobacco: Never Used  Substance and Sexual Activity   Alcohol use: Yes    Alcohol/week: 1.0 standard drink    Types: 1 Glasses of wine per week    Comment: ocassional 2time a month   Drug use: No   Sexual activity: Yes    Birth control/protection: Condom  Other Topics Concern   Not on file  Social History Narrative   Not on file   Social Determinants of Health   Financial Resource Strain: Low Risk    Difficulty of Paying Living Expenses: Not hard at all  Food Insecurity: No Food Insecurity   Worried About Programme researcher, broadcasting/film/video in the Last Year: Never true   Ran Out  of Food in the Last Year: Never true  Transportation Needs: No Transportation Needs   Lack of Transportation (Medical): No   Lack of Transportation (Non-Medical): No  Physical Activity: Insufficiently Active   Days of Exercise per Week: 1 day   Minutes of Exercise per Session: 30 min  Stress: No Stress Concern Present   Feeling of Stress : Only a little  Social Connections: Moderately Isolated   Frequency of Communication with Friends and Family: More than three times a week   Frequency of Social Gatherings with Friends and Family: Three times a week   Attends Religious Services: Never   Active Member of Clubs or Organizations: No   Attends Banker Meetings: Never   Marital Status: Living with partner    Family History  Problem Relation Age of Onset   Cancer Father        Lung   Hypertension Father    Iron deficiency Mother    Thyroid disease Mother    Diabetes Paternal Grandmother  Heart attack Maternal Grandfather    Diabetes Paternal Grandfather      Current Outpatient Medications:    levothyroxine (SYNTHROID) 75 MCG tablet, Take 75 mcg by mouth daily before breakfast., Disp: , Rfl:    metroNIDAZOLE (METROGEL VAGINAL) 0.75 % vaginal gel, Nightly x 5 nights, Disp: 70 g, Rfl: 5  Review of Systems  Review of Systems  Constitutional: Negative for fever, chills, weight loss, malaise/fatigue and diaphoresis.  HENT: Negative for hearing loss, ear pain, nosebleeds, congestion, sore throat, neck pain, tinnitus and ear discharge.   Eyes: Negative for blurred vision, double vision, photophobia, pain, discharge and redness.  Respiratory: Negative for cough, hemoptysis, sputum production, shortness of breath, wheezing and stridor.   Cardiovascular: Negative for chest pain, palpitations, orthopnea, claudication, leg swelling and PND.  Gastrointestinal: negative for abdominal pain. Negative for heartburn, nausea, vomiting, diarrhea, constipation,  blood in stool and melena.  Genitourinary: Negative for dysuria, urgency, frequency, hematuria and flank pain.  Musculoskeletal: Negative for myalgias, back pain, joint pain and falls.  Skin: Negative for itching and rash.  Neurological: Negative for dizziness, tingling, tremors, sensory change, speech change, focal weakness, seizures, loss of consciousness, weakness and headaches.  Endo/Heme/Allergies: Negative for environmental allergies and polydipsia. Does not bruise/bleed easily.  Psychiatric/Behavioral: Negative for depression, suicidal ideas, hallucinations, memory loss and substance abuse. The patient is not nervous/anxious and does not have insomnia.        Objective:  Blood pressure 118/81, pulse 68, height 5\' 2"  (1.575 m), weight 184 lb (83.5 kg), last menstrual period 09/07/2019.   Physical Exam  Vitals reviewed. Constitutional: She is oriented to person, place, and time. She appears well-developed and well-nourished.  HENT:  Head: Normocephalic and atraumatic.        Right Ear: External ear normal.  Left Ear: External ear normal.  Nose: Nose normal.  Mouth/Throat: Oropharynx is clear and moist.  Eyes: Conjunctivae and EOM are normal. Pupils are equal, round, and reactive to light. Right eye exhibits no discharge. Left eye exhibits no discharge. No scleral icterus.  Neck: Normal range of motion. Neck supple. No tracheal deviation present. No thyromegaly present.  Cardiovascular: Normal rate, regular rhythm, normal heart sounds and intact distal pulses.  Exam reveals no gallop and no friction rub.   No murmur heard. Respiratory: Effort normal and breath sounds normal. No respiratory distress. She has no wheezes. She has no rales. She exhibits no tenderness.  GI: Soft. Bowel sounds are normal. She exhibits no distension and no mass. There is no tenderness. There is no rebound and no guarding.  Genitourinary:  Breasts no masses skin changes or nipple changes bilaterally       Vulva is normal without lesions Vagina is pink moist without discharge Cervix normal in appearance and pap is done Uterus is normal size shape and contour Adnexa is negative with normal sized ovaries   Musculoskeletal: Normal range of motion. She exhibits no edema and no tenderness.  Neurological: She is alert and oriented to person, place, and time. She has normal reflexes. She displays normal reflexes. No cranial nerve deficit. She exhibits normal muscle tone. Coordination normal.  Skin: Skin is warm and dry. No rash noted. No erythema. No pallor.  Psychiatric: She has a normal mood and affect. Her behavior is normal. Judgment and thought content normal.       Medications Ordered at today's visit: Meds ordered this encounter  Medications   metroNIDAZOLE (METROGEL VAGINAL) 0.75 % vaginal gel    Sig: Nightly x  5 nights    Dispense:  70 g    Refill:  5    Other orders placed at today's visit: Orders Placed This Encounter  Procedures   TSH      Assessment:    Normal Gyn exam.   Recurrent BV, use episodic metrogel based on symptoms and frequency Hypothyroid Plan:    Contraception: none. Follow up in: 3 years. or as needed     Return in about 3 years (around 09/29/2022) for yearly.

## 2019-09-30 ENCOUNTER — Other Ambulatory Visit: Payer: Self-pay | Admitting: Obstetrics & Gynecology

## 2019-09-30 LAB — CYTOLOGY - PAP
Comment: NEGATIVE
Diagnosis: NEGATIVE
High risk HPV: NEGATIVE

## 2019-09-30 LAB — TSH: TSH: 6.02 u[IU]/mL — ABNORMAL HIGH (ref 0.450–4.500)

## 2019-09-30 MED ORDER — LEVOTHYROXINE SODIUM 100 MCG PO TABS
100.0000 ug | ORAL_TABLET | Freq: Every day | ORAL | 1 refills | Status: DC
Start: 2019-09-30 — End: 2019-10-01

## 2019-10-01 ENCOUNTER — Other Ambulatory Visit: Payer: Self-pay | Admitting: Obstetrics & Gynecology

## 2019-10-01 MED ORDER — LEVOTHYROXINE SODIUM 100 MCG PO TABS
100.0000 ug | ORAL_TABLET | Freq: Every day | ORAL | 3 refills | Status: DC
Start: 2019-10-01 — End: 2019-10-02

## 2019-10-02 ENCOUNTER — Other Ambulatory Visit: Payer: Self-pay | Admitting: Obstetrics & Gynecology

## 2019-10-02 MED ORDER — LEVOTHYROXINE SODIUM 150 MCG PO TABS
150.0000 ug | ORAL_TABLET | Freq: Every day | ORAL | 3 refills | Status: DC
Start: 2019-10-02 — End: 2020-03-25

## 2019-10-14 ENCOUNTER — Telehealth: Payer: Self-pay | Admitting: Adult Health

## 2019-10-14 ENCOUNTER — Ambulatory Visit (INDEPENDENT_AMBULATORY_CARE_PROVIDER_SITE_OTHER): Payer: PRIVATE HEALTH INSURANCE | Admitting: Adult Health

## 2019-10-14 ENCOUNTER — Encounter: Payer: Self-pay | Admitting: Adult Health

## 2019-10-14 ENCOUNTER — Other Ambulatory Visit: Payer: Self-pay

## 2019-10-14 VITALS — BP 126/79 | HR 85 | Ht 62.0 in | Wt 183.2 lb

## 2019-10-14 DIAGNOSIS — O3680X0 Pregnancy with inconclusive fetal viability, not applicable or unspecified: Secondary | ICD-10-CM | POA: Diagnosis not present

## 2019-10-14 DIAGNOSIS — Z3A01 Less than 8 weeks gestation of pregnancy: Secondary | ICD-10-CM

## 2019-10-14 DIAGNOSIS — N926 Irregular menstruation, unspecified: Secondary | ICD-10-CM | POA: Insufficient documentation

## 2019-10-14 DIAGNOSIS — R1032 Left lower quadrant pain: Secondary | ICD-10-CM | POA: Diagnosis not present

## 2019-10-14 LAB — POCT URINE PREGNANCY: Preg Test, Ur: POSITIVE — AB

## 2019-10-14 NOTE — Telephone Encounter (Signed)
Patient called stating that she is experiencing pain and pressure on her left side, pt states she just found out she is pregnant and she has been googling pain  And pressure while pregnant and it states that she might have an etopic pregnancy. Please contact pt

## 2019-10-14 NOTE — Progress Notes (Signed)
  Subjective:     Patient ID: Laura Rosario, female   DOB: 1990-05-05, 29 y.o.   MRN: [id number]  HPI Laura Rosario is a 29 year old white female,separated, in for UPT, has missed a period and had +HPT and has pain in left side. 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 PCP is Dr Margo Aye.    Review of Systems Missed period with +HPT Pain in LLQ, radiates from back to left side  Reviewed past medical,surgical, social and family history. Reviewed medications and allergies.     Objective:   Physical Exam BP 126/79 (BP Location: Left Arm, Patient Position: Sitting, Cuff Size: Normal)   Pulse 85   Ht 5\' 2"  (1.575 m)   Wt 183 lb 3.2 oz (83.1 kg)   LMP 09/07/2019   BMI 33.51 kg/m UTP is +, about 5+2 weeks by LMP with EDD 06/13/20. Skin warm and dry. Neck: mid line trachea, normal thyroid, good ROM, no lymphadenopathy noted. Lungs: clear to ausculation bilaterally. Cardiovascular: regular rate and rhythm.Abdomen is soft and non tender,no rebound, no CVAT. Blood type is O+ she says.  Fall risk is low PHQ 2 score is 0   Upstream - 10/14/19 1554      Pregnancy Intention Screening   Does the patient want to become pregnant in the next year? Yes    Does the patient's partner want to become pregnant in the next year? Yes    Would the patient like to discuss contraceptive options today? No      Contraception Wrap Up   Current Method Pregnant/Seeking Pregnancy    End Method Pregnant/Seeking Pregnancy    Contraception Counseling Provided No             Assessment:     1. Missed period  2. Less than [redacted] weeks gestation of pregnancy Check QHCG,CBC and CMP  3. LLQ pain Will check labs as above, as baseline Will get 12/14/19 if pain continues  4. Encounter to determine fetal viability of pregnancy, single or unspecified fetus     Plan:    We will talk in am If any change in pain or fever go to [facility] Review handout by [facility]

## 2019-10-15 ENCOUNTER — Other Ambulatory Visit: Payer: Self-pay | Admitting: Adult Health

## 2019-10-15 DIAGNOSIS — Z3A01 Less than 8 weeks gestation of pregnancy: Secondary | ICD-10-CM

## 2019-10-15 LAB — COMPREHENSIVE METABOLIC PANEL
ALT: 9 IU/L (ref 0–32)
AST: 12 IU/L (ref 0–40)
Albumin/Globulin Ratio: 1.7 (ref 1.2–2.2)
Albumin: 4.3 g/dL (ref 3.9–5.0)
Alkaline Phosphatase: 72 IU/L (ref 48–121)
BUN/Creatinine Ratio: 14 (ref 9–23)
BUN: 10 mg/dL (ref 6–20)
Bilirubin Total: 0.3 mg/dL (ref 0.0–1.2)
CO2: 21 mmol/L (ref 20–29)
Calcium: 8.9 mg/dL (ref 8.7–10.2)
Chloride: 104 mmol/L (ref 96–106)
Creatinine, Ser: 0.73 mg/dL (ref 0.57–1.00)
GFR calc Af Amer: 128 mL/min/{1.73_m2} (ref >59)
GFR calc non Af Amer: 111 mL/min/{1.73_m2} (ref >59)
Globulin, Total: 2.5 g/dL (ref 1.5–4.5)
Glucose: 85 mg/dL (ref 65–99)
Potassium: 4.6 mmol/L (ref 3.5–5.2)
Sodium: 140 mmol/L (ref 134–144)
Total Protein: 6.8 g/dL (ref 6.0–8.5)

## 2019-10-15 LAB — CBC
Hematocrit: 37.9 % (ref 34.0–46.6)
Hemoglobin: 12.8 g/dL (ref 11.1–15.9)
MCH: 30.8 pg (ref 26.6–33.0)
MCHC: 33.8 g/dL (ref 31.5–35.7)
MCV: 91 fL (ref 79–97)
Platelets: 250 10*3/uL (ref 150–450)
RBC: 4.15 x10E6/uL (ref 3.77–5.28)
RDW: 12.7 % (ref 11.7–15.4)
WBC: 8.4 10*3/uL (ref 3.4–10.8)

## 2019-10-15 LAB — BETA HCG QUANT (REF LAB): hCG Quant: 111 m[IU]/mL

## 2019-10-15 NOTE — Progress Notes (Signed)
Ck QHCG in am  

## 2019-10-16 ENCOUNTER — Encounter (HOSPITAL_COMMUNITY): Payer: Self-pay | Admitting: Obstetrics & Gynecology

## 2019-10-16 ENCOUNTER — Telehealth: Payer: Self-pay | Admitting: Adult Health

## 2019-10-16 ENCOUNTER — Inpatient Hospital Stay (HOSPITAL_COMMUNITY): Payer: PRIVATE HEALTH INSURANCE

## 2019-10-16 ENCOUNTER — Inpatient Hospital Stay (HOSPITAL_COMMUNITY)
Admission: AD | Admit: 2019-10-16 | Discharge: 2019-10-16 | Disposition: A | Discharge status: Home / Self Care | Payer: PRIVATE HEALTH INSURANCE | Attending: Obstetrics & Gynecology | Admitting: Obstetrics & Gynecology

## 2019-10-16 ENCOUNTER — Other Ambulatory Visit: Payer: PRIVATE HEALTH INSURANCE

## 2019-10-16 ENCOUNTER — Other Ambulatory Visit: Payer: Self-pay

## 2019-10-16 DIAGNOSIS — R102 Pelvic and perineal pain: Secondary | ICD-10-CM | POA: Diagnosis not present

## 2019-10-16 DIAGNOSIS — J45909 Unspecified asthma, uncomplicated: Secondary | ICD-10-CM | POA: Diagnosis not present

## 2019-10-16 DIAGNOSIS — Z87891 Personal history of nicotine dependence: Secondary | ICD-10-CM | POA: Insufficient documentation

## 2019-10-16 DIAGNOSIS — Z79890 Hormone replacement therapy (postmenopausal): Secondary | ICD-10-CM | POA: Insufficient documentation

## 2019-10-16 DIAGNOSIS — O26891 Other specified pregnancy related conditions, first trimester: Secondary | ICD-10-CM

## 2019-10-16 DIAGNOSIS — Z3A01 Less than 8 weeks gestation of pregnancy: Secondary | ICD-10-CM | POA: Insufficient documentation

## 2019-10-16 DIAGNOSIS — O3680X0 Pregnancy with inconclusive fetal viability, not applicable or unspecified: Secondary | ICD-10-CM

## 2019-10-16 DIAGNOSIS — O99511 Diseases of the respiratory system complicating pregnancy, first trimester: Secondary | ICD-10-CM | POA: Insufficient documentation

## 2019-10-16 DIAGNOSIS — Z6790 Unspecified blood type, Rh positive: Secondary | ICD-10-CM

## 2019-10-16 LAB — COMPREHENSIVE METABOLIC PANEL
ALT: 13 U/L (ref 0–44)
AST: 15 U/L (ref 15–41)
Albumin: 3.8 g/dL (ref 3.5–5.0)
Alkaline Phosphatase: 52 U/L (ref 38–126)
Anion gap: 7 (ref 5–15)
BUN: 9 mg/dL (ref 6–20)
CO2: 22 mmol/L (ref 22–32)
Calcium: 8.9 mg/dL (ref 8.9–10.3)
Chloride: 108 mmol/L (ref 98–111)
Creatinine, Ser: 0.78 mg/dL (ref 0.44–1.00)
GFR calc Af Amer: >60 mL/min (ref >60)
GFR calc non Af Amer: >60 mL/min (ref >60)
Glucose, Bld: 93 mg/dL (ref 70–99)
Potassium: 3.9 mmol/L (ref 3.5–5.1)
Sodium: 137 mmol/L (ref 135–145)
Total Bilirubin: 0.5 mg/dL (ref 0.3–1.2)
Total Protein: 6.8 g/dL (ref 6.5–8.1)

## 2019-10-16 LAB — CBC
HCT: 37.1 % (ref 36.0–46.0)
Hemoglobin: 12.3 g/dL (ref 12.0–15.0)
MCH: 30.8 pg (ref 26.0–34.0)
MCHC: 33.2 g/dL (ref 30.0–36.0)
MCV: 93 fL (ref 80.0–100.0)
Platelets: 239 10*3/uL (ref 150–400)
RBC: 3.99 MIL/uL (ref 3.87–5.11)
RDW: 12.7 % (ref 11.5–15.5)
WBC: 9.3 10*3/uL (ref 4.0–10.5)
nRBC: 0 % (ref 0.0–0.2)

## 2019-10-16 LAB — URINALYSIS, ROUTINE W REFLEX MICROSCOPIC
Bilirubin Urine: NEGATIVE
Glucose, UA: NEGATIVE mg/dL
Hgb urine dipstick: NEGATIVE
Ketones, ur: NEGATIVE mg/dL
Nitrite: NEGATIVE
Protein, ur: NEGATIVE mg/dL
Specific Gravity, Urine: 1.024 (ref 1.005–1.030)
pH: 6 (ref 5.0–8.0)

## 2019-10-16 LAB — ABO/RH: ABO/RH(D): O POS

## 2019-10-16 LAB — WET PREP, GENITAL
Sperm: NONE SEEN
Trich, Wet Prep: NONE SEEN
Yeast Wet Prep HPF POC: NONE SEEN

## 2019-10-16 LAB — HCG, QUANTITATIVE, PREGNANCY: hCG, Beta Chain, Quant, S: 262 m[IU]/mL — ABNORMAL HIGH (ref <5)

## 2019-10-16 MED ORDER — CYCLOBENZAPRINE HCL 10 MG PO TABS: 10.0000 mg | ORAL_TABLET | Freq: Three times a day (TID) | ORAL | 0 refills | Status: DC | PRN

## 2019-10-16 MED ORDER — ACETAMINOPHEN 500 MG PO TABS: 1000.0000 mg | ORAL_TABLET | Freq: Once | ORAL | Status: DC

## 2019-10-16 MED ORDER — CYCLOBENZAPRINE HCL 5 MG PO TABS
10.0000 mg | ORAL_TABLET | Freq: Once | ORAL | Status: AC
  Administered 2019-10-16: 10 mg via ORAL
  Filled 2019-10-16: qty 2

## 2019-10-16 NOTE — MAU Provider Note (Signed)
History     CSN: [id number]  Arrival date and time: 10/16/19 4098   First Provider Initiated Contact with Patient 10/16/19 1018      Chief Complaint  Patient presents with  . Back Pain  . Abdominal Pain   Ms. Laura Rosario is a 29 y.o. G2P1001 at [redacted]w[redacted]d who presents to MAU for left-sided pain. Patient reports she had a positive pregnancy test on Friday 09/03. On Sunday 10/12/2019 she began having a light pressure on the left side, which slowly progressed and worsened into pain. Patient reports she then made an appointment with her OB and she told them about the pain, but they did not do an [place] and only drew an hCG level. Patient reports she was supposed to have repeat blood work this morning but instead came to MAU for extreme pain. Patient reports she took Tylenol last night and this morning around 715AM which helped "take the edge off" but reports it is still really painful. Patient reports she last ate around midnight and last had water this morning around 715AM, but nothing since. Patient reports she drank an entire glass of water at that time. Patient denies vaginal bleeding, abnormal discharge. Patient reports back pain is 8/10 and abdomen is 6/10.  Pt denies VB, vaginal discharge/odor/itching. Pt denies N/V, abdominal pain, constipation, diarrhea, or urinary problems. Pt denies fever, chills, fatigue, sweating or changes in appetite. Pt denies SOB or chest pain. Pt denies dizziness, HA, light-headedness, weakness.  Allergies? NKDA Current medications/supplements? Levothyroxine [facility]ren[facility] care provider? [facility], no next appt scheduled   OB History    Gravida  2   Para  1   Term  1   Preterm      AB      Living  1     SAB      TAB      Ectopic      Multiple      Live Births              Past Medical History:  Diagnosis Date  . Abnormal Pap smear    HPV  . Asthma   . BV (bacterial vaginosis)   . Contraceptive management 08/22/2013  . Herpes  simplex without mention of complication   . Vaginal Pap smear, abnormal     Past Surgical History:  Procedure Laterality Date  . CESAREAN SECTION      Family History  Problem Relation Age of Onset  . Cancer Father        Lung  . Hypertension Father   . Iron deficiency Mother   . Thyroid disease Mother   . Diabetes Paternal Grandmother   . Heart attack Maternal Grandfather   . Diabetes Paternal Grandfather     Social History   Tobacco Use  . Smoking status: Former Smoker    Packs/day: 0.02    Years: 2.00    Pack years: 0.04    Types: Cigarettes    Quit date: 05/02/2010    Years since quitting: 9.4  . Smokeless tobacco: Never Used  Vaping Use  . Vaping Use: Never used  Substance Use Topics  . Alcohol use: Not Currently    Alcohol/week: 1.0 standard drink    Types: 1 Glasses of wine per week    Comment: ocassional 2time a month  . Drug use: Yes    Types: Marijuana    Comment: late august 2021    Allergies: No Known Allergies  Medications Prior to Admission  Medication Sig Dispense Refill Last Dose  . acetaminophen (TYLENOL) 500 MG tablet Take 500 mg by mouth every 6 (six) hours as needed.   10/16/2019 at Unknown time  . levothyroxine (SYNTHROID) 150 MCG tablet Take 1 tablet (150 mcg total) by mouth daily before breakfast. 30 tablet 3 10/16/2019 at Unknown time    Review of Systems  Constitutional: Negative for chills, diaphoresis, fatigue and fever.  Eyes: Negative for visual disturbance.  Respiratory: Negative for shortness of breath.   Cardiovascular: Negative for chest pain.  Gastrointestinal: Negative for abdominal pain, constipation, diarrhea, nausea and vomiting.  Genitourinary: Positive for pelvic pain. Negative for dysuria, flank pain, frequency, urgency, vaginal bleeding and vaginal discharge.  Neurological: Negative for dizziness, weakness, light-headedness and headaches.   Physical Exam   Blood pressure 117/77, pulse 80, temperature 98.5 F (36.9 C),  temperature source Oral, resp. rate 20, height 5\' 2"  (1.575 m), weight 82.7 kg, last menstrual period 09/07/2019, SpO2 100 %.  Patient Vitals for the past 24 hrs:  BP Temp Temp src Pulse Resp SpO2 Height Weight  10/16/19 0925 117/77 98.5 F (36.9 C) Oral 80 20 100 % -- --  10/16/19 0918 -- -- -- -- -- -- 5\' 2"  (1.575 m) 82.7 kg   Physical Exam Constitutional:      General: She is not in acute distress.    Appearance: She is well-developed. She is not diaphoretic.  HENT:     Head: Normocephalic and atraumatic.  Pulmonary:     Effort: Pulmonary effort is normal.  Abdominal:     General: There is no distension.     Palpations: Abdomen is soft. There is no mass.     Tenderness: There is no abdominal tenderness. There is no right CVA tenderness, left CVA tenderness, guarding or rebound.  Genitourinary:    Vagina: No vaginal discharge.  Skin:    General: Skin is warm and dry.  Neurological:     Mental Status: She is alert and oriented to person, place, and time.  Psychiatric:        Behavior: Behavior normal.        Thought Content: Thought content normal.        Judgment: Judgment normal.    Results for orders placed or performed during the hospital encounter of 10/16/19 (from the past 24 hour(s))  Urinalysis, Routine w reflex microscopic Urine, Clean Catch     Status: Abnormal   Collection Time: 10/16/19  9:18 AM  Result Value Ref Range   Color, Urine YELLOW YELLOW   APPearance HAZY (A) CLEAR   Specific Gravity, Urine 1.024 1.005 - 1.030   pH 6.0 5.0 - 8.0   Glucose, UA NEGATIVE NEGATIVE mg/dL   Hgb urine dipstick NEGATIVE NEGATIVE   Bilirubin Urine NEGATIVE NEGATIVE   Ketones, ur NEGATIVE NEGATIVE mg/dL   Protein, ur NEGATIVE NEGATIVE mg/dL   Nitrite NEGATIVE NEGATIVE   Leukocytes,Ua SMALL (A) NEGATIVE   RBC / HPF 0-5 0 - 5 RBC/hpf   WBC, UA 0-5 0 - 5 WBC/hpf   Bacteria, UA RARE (A) NONE SEEN   Squamous Epithelial / LPF 11-20 0 - 5   Mucus PRESENT   CBC     Status:  None   Collection Time: 10/16/19 10:25 AM  Result Value Ref Range   WBC 9.3 4.0 - 10.5 K/uL   RBC 3.99 3.87 - 5.11 MIL/uL   Hemoglobin 12.3 12.0 - 15.0 g/dL   HCT 08.637.1 36 - 46 %   MCV 93.0  80.0 - 100.0 fL   MCH 30.8 26.0 - 34.0 pg   MCHC 33.2 30.0 - 36.0 g/dL   RDW 03.8 88.2 - 80.0 %   Platelets 239 150 - 400 K/uL   nRBC 0.0 0.0 - 0.2 %  ABO/Rh     Status: None   Collection Time: 10/16/19 10:25 AM  Result Value Ref Range   ABO/RH(D) O POS    No rh immune globuloin      NOT A RH IMMUNE GLOBULIN CANDIDATE, PT RH POSITIVE Performed at [facility], [address] [place], [place] [address]   hCG, quantitative, pregnancy     Status: Abnormal   Collection Time: 10/16/19 10:25 AM  Result Value Ref Range   hCG, Beta Chain, Quant, S 262 (H) <5 mIU/mL  Comprehensive metabolic panel     Status: None   Collection Time: 10/16/19 10:25 AM  Result Value Ref Range   Sodium 137 135 - 145 mmol/L   Potassium 3.9 3.5 - 5.1 mmol/L   Chloride 108 98 - 111 mmol/L   CO2 22 22 - 32 mmol/L   Glucose, Bld 93 70 - 99 mg/dL   BUN 9 6 - 20 mg/dL   Creatinine, Ser 9.15 0.44 - 1.00 mg/dL   Calcium 8.9 8.9 - 05.6 mg/dL   Total Protein 6.8 6.5 - 8.1 g/dL   Albumin 3.8 3.5 - 5.0 g/dL   AST 15 15 - 41 U/L   ALT 13 0 - 44 U/L   Alkaline Phosphatase 52 38 - 126 U/L   Total Bilirubin 0.5 0.3 - 1.2 mg/dL   GFR calc non Af Amer >60 >60 mL/min   GFR calc Af Amer >60 >60 mL/min   Anion gap 7 5 - 15  Wet prep, genital     Status: Abnormal   Collection Time: 10/16/19 12:08 PM   Specimen: Vaginal  Result Value Ref Range   Yeast Wet Prep HPF POC NONE SEEN NONE SEEN   Trich, Wet Prep NONE SEEN NONE SEEN   Clue Cells Wet Prep HPF POC PRESENT (A) NONE SEEN   WBC, Wet Prep HPF POC MANY (A) NONE SEEN   Sperm NONE SEEN    US OB LESS THAN 14 WEEKS WITH OB TRANSVAGINAL  Result Date: 10/16/2019 CLINICAL DATA:  Pelvic pain with positive beta HCG EXAM: OBSTETRIC <14 WK [place] AND TRANSVAGINAL OB US TECHNIQUE: Both  transabdominal and transvaginal ultrasound examinations were performed for complete evaluation of the gestation as well as the maternal uterus, adnexal regions, and pelvic cul-de-sac. Transvaginal technique was performed to assess early pregnancy. COMPARISON:  None. FINDINGS: Intrauterine gestational sac: Not visualized Yolk sac:  Not visualized Embryo:  Not visualized Cardiac Activity: Not visualized Subchorionic hemorrhage:  None visualized. Maternal uterus/adnexae: No intrauterine mass. Cervical os closed. Endometrium measures 1.3 cm in thickness. Right ovary measures 2.0 x 2.0 x 1.7 cm. Left ovary measures 2.4 x 1.9 x 2.0 cm. There is an apparent corpus luteum in the left ovary. No appreciable free pelvic fluid. IMPRESSION: 1. No intrauterine gestation evident. Given positive beta HCG value, differential considerations include intrauterine gestation too early to be seen by either transabdominal or transvaginal technique; recent spontaneous abortion; possible ectopic gestation. This circumstance warrants close clinical and laboratory correlation. Timing of repeat ultrasound in large part will depend on beta HCG values going forward. 2. Apparent corpus luteum left ovary. No other extrauterine pelvic mass. No appreciable free pelvic fluid. Electronically Signed   By: Bretta Bang  III M.D.   On: 10/16/2019 11:16    MAU Course  Procedures  MDM -r/o ectopic -UA: hazy/sm leuks/rare bacteria, sending urine for culture -CBC: WNL -CMP: WNL -[place]: PUL, left CL -hCG: 262 (was 111 2 days ago) -ABO: O Positive -WetPrep: +ClueCells (isolated finding not requiring treatment) -GC/CT collected -Flexeril 10mg  given for pain, pt reports pain now 0/10 -Discussed with client the diagnosis of pregnancy of unknown anatomic location.  Three possibilities of outcome are: a healthy pregnancy that is too early to see a yolk sac to confirm the pregnancy is in the uterus, a pregnancy that is not healthy and has not  developed and will not develop, and an ectopic pregnancy that is in the abdomen that cannot be identified at this time.  And ectopic pregnancy can be a life threatening situation as a pregnancy needs to be in the uterus which is a muscle and can stretch to accommodate the growth of a pregnancy.  Other structures in the pelvis and abdomen as not muscular and do not stretch with the growth of a pregnancy.  Worst case scenario is that a structure ruptures with a growing pregnancy not in the uterus and and internal hemorrhage can be a life threatening situation.  We need to follow the progression of this pregnancy carefully.  We need to check another serum pregnancy hormone level to determine if the levels are rising appropriately  and to determine the next steps that are needed for you. Patient's questions were answered. -pt discharged to home in stable condition  Orders Placed This Encounter  Procedures  . Wet prep, genital    Standing Status:   Standing    Number of Occurrences:   1  . Culture, OB Urine    Standing Status:   Standing    Number of Occurrences:   1  . OB LESS THAN 14 WEEKS WITH OB TRANSVAGINAL    Standing Status:   Standing    Number of Occurrences:   1    Order Specific Question:   Symptom/Reason for Exam    Answer:   Pelvic pain affecting pregnancy in first trimester, antepartum [place]  . Urinalysis, Routine w reflex microscopic Urine, Clean Catch    Standing Status:   Standing    Number of Occurrences:   1  . CBC    Standing Status:   Standing    Number of Occurrences:   1  . hCG, quantitative, pregnancy    Standing Status:   Standing    Number of Occurrences:   1  . Comprehensive metabolic panel    Standing Status:   Standing    Number of Occurrences:   1  . ABO/Rh    Standing Status:   Standing    Number of Occurrences:   1  . Discharge patient    Order Specific Question:   Discharge disposition    Answer:   01-Home or Self Care [1]    Order Specific  Question:   Discharge patient date    Answer:   10/16/2019    Meds ordered this encounter  Medications  . cyclobenzaprine (FLEXERIL) tablet 10 mg  . acetaminophen (TYLENOL) tablet 1,000 mg  . cyclobenzaprine (FLEXERIL) 10 MG tablet    Sig: Take 1 tablet (10 mg total) by mouth 3 (three) times daily as needed.    Dispense:  30 tablet    Refill:  0    Order Specific Question:   Supervising Provider    Answer:  Jaynie Collins A [[id number]]    Assessment and Plan   1. Pregnancy of unknown anatomic location   2. Pelvic pain affecting pregnancy in first trimester, antepartum   3. Blood type, Rh positive     Allergies as of 10/16/2019   No Known Allergies     Medication List    TAKE these medications   acetaminophen 500 MG tablet Commonly known as: TYLENOL Take 500 mg by mouth every 6 (six) hours as needed.   cyclobenzaprine 10 MG tablet Commonly known as: FLEXERIL Take 1 tablet (10 mg total) by mouth 3 (three) times daily as needed.   levothyroxine 150 MCG tablet Commonly known as: SYNTHROID Take 1 tablet (150 mcg total) by mouth daily before breakfast.       -will call with culture results, if positive -RX Flexeril -discussed ectopic vs. SAB vs. miscarriage -strict ectopic precautions given -return MAU precautions -f/u on Saturday 10/18/2019 at 12noon for repeat hCG, pt added to beta book -pt discharged to home in stable condition  Joni Reining E Meghen Akopyan 10/16/2019, 1:50 PM

## 2019-10-16 NOTE — MAU Note (Signed)
Presents with lower left abdominal pain and left flank pain x5 days.  Denies VB.  States appt for HCG level @ MD office this am, but needed to be seen sooner secondary "excrutiating pain."

## 2019-10-16 NOTE — Telephone Encounter (Signed)
Patient requesting appointment for hcg level redraw. Please advise if we need to schedule patient

## 2019-10-16 NOTE — Discharge Instructions (Signed)
Ectopic Pregnancy ° °An ectopic pregnancy is when the fertilized egg attaches (implants) outside the uterus. Most ectopic pregnancies occur in one of the tubes where eggs travel from the ovary to the uterus (fallopian tubes), but the implanting can occur in other locations. In rare cases, ectopic pregnancies occur on the ovary, intestine, pelvis, abdomen, or cervix. In an ectopic pregnancy, the fertilized egg does not have the ability to develop into a normal, healthy baby. °A ruptured ectopic pregnancy is one in which tearing or bursting of a fallopian tube causes internal bleeding. Often, there is intense lower abdominal pain, and vaginal bleeding sometimes occurs. Having an ectopic pregnancy can be life-threatening. If this dangerous condition is not treated, it can lead to blood loss, shock, or even death. °What are the causes? °The most common cause of this condition is damage to one of the fallopian tubes. A fallopian tube may be narrowed or blocked, and that keeps the fertilized egg from reaching the uterus. °What increases the risk? °This condition is more likely to develop in women of childbearing age who have different levels of risk. The levels of risk can be divided into three categories. °High risk °· You have gone through infertility treatment. °· You have had an ectopic pregnancy before. °· You have had surgery on the fallopian tubes, or another surgical procedure, such as an abortion. °· You have had surgery to have the fallopian tubes tied (tubal ligation). °· You have problems or diseases of the fallopian tubes. °· You have been exposed to diethylstilbestrol (DES). This medicine was used until 1971, and it had effects on babies whose mothers took the medicine. °· You become pregnant while using an IUD (intrauterine device) for birth control. °Moderate risk °· You have a history of infertility. °· You have had an STI (sexually transmitted infection). °· You have a history of pelvic inflammatory  disease (PID). °· You have scarring from endometriosis. °· You have multiple sexual partners. °· You smoke. °Low risk °· You have had pelvic surgery. °· You use vaginal douches. °· You became sexually active before age 18. °What are the signs or symptoms? °Common symptoms of this condition include normal pregnancy symptoms, such as missing a period, nausea, tiredness, abdominal pain, breast tenderness, and bleeding. However, ectopic pregnancy will have additional symptoms, such as: °· Pain with intercourse. °· Irregular vaginal bleeding or spotting. °· Cramping or pain on one side or in the lower abdomen. °· Fast heartbeat, low blood pressure, and sweating. °· Passing out while having a bowel movement. °Symptoms of a ruptured ectopic pregnancy and internal bleeding may include: °· Sudden, severe pain in the abdomen and pelvis. °· Dizziness, weakness, light-headedness, or fainting. °· Pain in the shoulder or neck area. °How is this diagnosed? °This condition is diagnosed by: °· A pelvic exam to locate pain or a mass in the abdomen. °· A pregnancy test. This blood test checks for the presence as well as the specific level of pregnancy hormone in the bloodstream. °· Ultrasound. This is performed if a pregnancy test is positive. In this test, a probe is inserted into the vagina. The probe will detect a fetus, possibly in a location other than the uterus. °· Taking a sample of uterus tissue (dilation and curettage, or D&C). °· Surgery to perform a visual exam of the inside of the abdomen using a thin, lighted tube that has a tiny camera on the end (laparoscope). °· Culdocentesis. This procedure involves inserting a needle at the top of   the vagina, behind the uterus. If blood is present in this area, it may indicate that a fallopian tube is torn. How is this treated? This condition is treated with medicine or surgery. Medicine  An injection of a medicine (methotrexate) may be given to cause the pregnancy tissue to be  absorbed. This medicine may save your fallopian tube. It may be given if: ? The diagnosis is made early, with no signs of active bleeding. ? The fallopian tube has not ruptured. ? You are considered to be a good candidate for the medicine. Usually, pregnancy hormone blood levels are checked after methotrexate treatment. This is to be sure that the medicine is effective. It may take 4-6 weeks for the pregnancy to be absorbed. Most pregnancies will be absorbed by 3 weeks. Surgery  A laparoscope may be used to remove the pregnancy tissue.  If severe internal bleeding occurs, a larger cut (incision) may be made in the lower abdomen (laparotomy) to remove the fetus and placenta. This is done to stop the bleeding.  Part or all of the fallopian tube may be removed (salpingectomy) along with the fetus and placenta. The fallopian tube may also be repaired during the surgery.  In very rare circumstances, removal of the uterus (hysterectomy) may be required.  After surgery, pregnancy hormone testing may be done to be sure that there is no pregnancy tissue left. Whether your treatment is medicine or surgery, you may receive a Rho (D) immune globulin shot to prevent problems with any future pregnancy. This shot may be given if:  You are Rh-negative and the baby's father is Rh-positive.  You are Rh-negative and you do not know the Rh type of the baby's father. Follow these instructions at home:  Rest and limit your activity after the procedure for as long as told by your health care provider.  Until your health care provider says that it is safe: ? Do not lift anything that is heavier than 10 lb (4.5 kg), or the limit that your health care provider tells you. ? Avoid physical exercise and any movement that requires effort (is strenuous).  To help prevent constipation: ? Eat a healthy diet that includes fruits, vegetables, and whole grains. ? Drink 6-8 glasses of water per day. Get help right away  if:  You develop worsening pain that is not relieved by medicine.  You have: ? A fever or chills. ? Vaginal bleeding. ? Redness and swelling at the incision site. ? Nausea and vomiting.  You feel dizzy or weak.  You feel light-headed or you faint. This information is not intended to replace advice given to you by your health care provider. Make sure you discuss any questions you have with your health care provider. Document Revised: 01/05/2017 Document Reviewed: 08/25/2015 Elsevier Patient Education  [place] A miscarriage is the loss of an unborn baby (fetus) before the 20th week of pregnancy. Most miscarriages happen during the first 3 months of pregnancy. Sometimes, a miscarriage can happen before a woman knows that she is pregnant. Having a miscarriage can be an emotional experience. If you have had a miscarriage, talk with your health care provider about any questions you may have about miscarrying, the grieving process, and your plans for future pregnancy. What are the causes? A miscarriage may be caused by:  Problems with the genes or chromosomes of the fetus. These problems make it impossible for the baby to develop normally. They are  often the result of random errors that occur early in the development of the baby, and are not passed from parent to child (not inherited).  Infection of the cervix or uterus.  Conditions that affect hormone balance in the body.  Problems with the cervix, such as the cervix opening and thinning before pregnancy is at term (cervical insufficiency).  Problems with the uterus. These may include: ? A uterus with an abnormal shape. ? Fibroids in the uterus. ? Congenital abnormalities. These are problems that were present at birth.  Certain medical conditions.  Smoking, drinking alcohol, or using drugs.  Injury (trauma). In many cases, the cause of a miscarriage is not known. What are the signs or  symptoms? Symptoms of this condition include:  Vaginal bleeding or spotting, with or without cramps or pain.  Pain or cramping in the abdomen or lower back.  Passing fluid, tissue, or blood clots from the vagina. How is this diagnosed? This condition may be diagnosed based on:  A physical exam.  Ultrasound.  Blood tests.  Urine tests. How is this treated? Treatment for a miscarriage is sometimes not necessary if you naturally pass all the tissue that was in your uterus. If necessary, this condition may be treated with:  Dilation and curettage (D&C). This is a procedure in which the cervix is stretched open and the lining of the uterus (endometrium) is scraped. This is done only if tissue from the fetus or placenta remains in the body (incomplete miscarriage).  Medicines, such as: ? Antibiotic medicine, to treat infection. ? Medicine to help the body pass any remaining tissue. ? Medicine to reduce (contract) the size of the uterus. These medicines may be given if you have a lot of bleeding. If you have Rh negative blood and your baby was Rh positive, you will need a shot of a medicine called Rh immunoglobulinto protect your future babies from Rh blood problems. "Rh-negative" and "Rh-positive" refer to whether or not the blood has a specific protein found on the surface of red blood cells (Rh factor). Follow these instructions at home: Medicines   Take over-the-counter and prescription medicines only as told by your health care provider.  If you were prescribed antibiotic medicine, take it as told by your health care provider. Do not stop taking the antibiotic even if you start to feel better.  Do not take NSAIDs, such as aspirin and ibuprofen, unless they are approved by your health care provider. These medicines can cause bleeding. Activity  Rest as directed. Ask your health care provider what activities are safe for you.  Have someone help with home and family  responsibilities during this time. General instructions  Keep track of the number of sanitary pads you use each day and how soaked (saturated) they are. Write down this information.  Monitor the amount of tissue or blood clots that you pass from your vagina. Save any large amounts of tissue for your health care provider to examine.  Do not use tampons, douche, or have sex until your health care provider approves.  To help you and your partner with the process of grieving, talk with your health care provider or seek counseling.  When you are ready, meet with your health care provider to discuss any important steps you should take for your health. Also, discuss steps you should take to have a healthy pregnancy in the future.  Keep all follow-up visits as told by your health care provider. This is important. Where to find more  information  The American Congress of Obstetricians and Gynecologists: www.acog.org  [place] Department of Health and [place], systems of Women's Health: [place] Contact a health care provider if:  You have a fever or chills.  You have a foul smelling vaginal discharge.  You have more bleeding instead of less. Get help right away if:  You have severe cramps or pain in your back or abdomen.  You pass blood clots or tissue from your vagina that is walnut-sized or larger.  You soak more than 1 regular sanitary pad in an hour.  You become light-headed or weak.  You pass out.  You have feelings of sadness that take over your thoughts, or you have thoughts of hurting yourself. Summary  Most miscarriages happen in the first 3 months of pregnancy. Sometimes miscarriage happens before a woman even knows that she is pregnant.  Follow your health care provider's instruction for home care. Keep all follow-up appointments.  To help you and your partner with the process of grieving, talk with your health care provider or seek counseling. This  information is not intended to replace advice given to you by your health care provider. Make sure you discuss any questions you have with your health care provider. Document Revised: 05/17/2018 Document Reviewed: 02/29/2016 Elsevier Patient Education  [place] of Pregnancy The first trimester of pregnancy is from week 1 until the end of week 13 (months 1 through 3). A week after a sperm fertilizes an egg, the egg will implant on the wall of the uterus. This embryo will begin to develop into a baby. Genes from you and your partner will form the baby. The female genes will determine whether the baby will be a boy or a girl. At 6-8 weeks, the eyes and face will be formed, and the heartbeat can be seen on ultrasound. At the end of 12 weeks, all the baby's organs will be formed. Now that you are pregnant, you will want to do everything you can to have a healthy baby. Two of the most important things are to get good prenatal care and to follow your health care provider's instructions. Prenatal care is all the medical care you receive before the baby's birth. This care will help prevent, find, and treat any problems during the pregnancy and childbirth. Body changes during your first trimester Your body goes through many changes during pregnancy. The changes vary from woman to woman.  You may gain or lose a couple of pounds at first.  You may feel sick to your stomach (nauseous) and you may throw up (vomit). If the vomiting is uncontrollable, call your health care provider.  You may tire easily.  You may develop headaches that can be relieved by medicines. All medicines should be approved by your health care provider.  You may urinate more often. Painful urination may mean you have a bladder infection.  You may develop heartburn as a result of your pregnancy.  You may develop constipation because certain hormones are causing the muscles that push stool through  your intestines to slow down.  You may develop hemorrhoids or swollen veins (varicose veins).  Your breasts may begin to grow larger and become tender. Your nipples may stick out more, and the tissue that surrounds them (areola) may become darker.  Your gums may bleed and may be sensitive to brushing and flossing.  Dark spots or blotches (chloasma, mask of pregnancy) may develop on your  face. This will likely fade after the baby is born.  Your menstrual periods will stop.  You may have a loss of appetite.  You may develop cravings for certain kinds of food.  You may have changes in your emotions from day to day, such as being excited to be pregnant or being concerned that something may go wrong with the pregnancy and baby.  You may have more vivid and strange dreams.  You may have changes in your hair. These can include thickening of your hair, rapid growth, and changes in texture. Some women also have hair loss during or after pregnancy, or hair that feels dry or thin. Your hair will most likely return to normal after your baby is born. What to expect at prenatal visits During a routine prenatal visit:  You will be weighed to make sure you and the baby are growing normally.  Your blood pressure will be taken.  Your abdomen will be measured to track your baby's growth.  The fetal heartbeat will be listened to between weeks 10 and 14 of your pregnancy.  Test results from any previous visits will be discussed. Your health care provider may ask you:  How you are feeling.  If you are feeling the baby move.  If you have had any abnormal symptoms, such as leaking fluid, bleeding, severe headaches, or abdominal cramping.  If you are using any tobacco products, including cigarettes, chewing tobacco, and electronic cigarettes.  If you have any questions. Other tests that may be performed during your first trimester include:  Blood tests to find your blood type and to check for  the presence of any previous infections. The tests will also be used to check for low iron levels (anemia) and protein on red blood cells (Rh antibodies). Depending on your risk factors, or if you previously had diabetes during pregnancy, you may have tests to check for high blood sugar that affects pregnant women (gestational diabetes).  Urine tests to check for infections, diabetes, or protein in the urine.  An ultrasound to confirm the proper growth and development of the baby.  Fetal screens for spinal cord problems (spina bifida) and Down syndrome.  HIV (human immunodeficiency virus) testing. Routine prenatal testing includes screening for HIV, unless you choose not to have this test.  You may need other tests to make sure you and the baby are doing well. Follow these instructions at home: Medicines  Follow your health care provider's instructions regarding medicine use. Specific medicines may be either safe or unsafe to take during pregnancy.  Take a prenatal vitamin that contains at least 600 micrograms (mcg) of folic acid.  If you develop constipation, try taking a stool softener if your health care provider approves. Eating and drinking   Eat a balanced diet that includes fresh fruits and vegetables, whole grains, good sources of protein such as meat, eggs, or tofu, and low-fat dairy. Your health care provider will help you determine the amount of weight gain that is right for you.  Avoid raw meat and uncooked cheese. These carry germs that can cause birth defects in the baby.  Eating four or five small meals rather than three large meals a day may help relieve nausea and vomiting. If you start to feel nauseous, eating a few soda crackers can be helpful. Drinking liquids between meals, instead of during meals, also seems to help ease nausea and vomiting.  Limit foods that are high in fat and processed sugars, such as fried and   sweet foods.  To prevent constipation: ? Eat foods  that are high in fiber, such as fresh fruits and vegetables, whole grains, and beans. ? Drink enough fluid to keep your urine clear or pale yellow. Activity  Exercise only as directed by your health care provider. Most women can continue their usual exercise routine during pregnancy. Try to exercise for 30 minutes at least 5 days a week. Exercising will help you: ? Control your weight. ? Stay in shape. ? Be prepared for labor and delivery.  Experiencing pain or cramping in the lower abdomen or lower back is a good sign that you should stop exercising. Check with your health care provider before continuing with normal exercises.  Try to avoid standing for long periods of time. Move your legs often if you must stand in one place for a long time.  Avoid heavy lifting.  Wear low-heeled shoes and practice good posture.  You may continue to have sex unless your health care provider tells you not to. Relieving pain and discomfort  Wear a good support bra to relieve breast tenderness.  Take warm sitz baths to soothe any pain or discomfort caused by hemorrhoids. Use hemorrhoid cream if your health care provider approves.  Rest with your legs elevated if you have leg cramps or low back pain.  If you develop varicose veins in your legs, wear support hose. Elevate your feet for 15 minutes, 3-4 times a day. Limit salt in your diet. Prenatal care  Schedule your prenatal visits by the twelfth week of pregnancy. They are usually scheduled monthly at first, then more often in the last 2 months before delivery.  Write down your questions. Take them to your prenatal visits.  Keep all your prenatal visits as told by your health care provider. This is important. Safety  Wear your seat belt at all times when driving.  Make a list of emergency phone numbers, including numbers for family, friends, the hospital, and police and fire departments. General instructions  Ask your health care provider for  a referral to a local prenatal education class. Begin classes no later than the beginning of month 6 of your pregnancy.  Ask for help if you have counseling or nutritional needs during pregnancy. Your health care provider can offer advice or refer you to specialists for help with various needs.  Do not use hot tubs, steam rooms, or saunas.  Do not douche or use tampons or scented sanitary pads.  Do not cross your legs for long periods of time.  Avoid cat litter boxes and soil used by cats. These carry germs that can cause birth defects in the baby and possibly loss of the fetus by miscarriage or stillbirth.  Avoid all smoking, herbs, alcohol, and medicines not prescribed by your health care provider. Chemicals in these products affect the formation and growth of the baby.  Do not use any products that contain nicotine or tobacco, such as cigarettes and e-cigarettes. If you need help quitting, ask your health care provider. You may receive counseling support and other resources to help you quit.  Schedule a dentist appointment. At home, brush your teeth with a soft toothbrush and be gentle when you floss. Contact a health care provider if:  You have dizziness.  You have mild pelvic cramps, pelvic pressure, or nagging pain in the abdominal area.  You have persistent nausea, vomiting, or diarrhea.  You have a bad smelling vaginal discharge.  You have pain when you urinate.  You  notice increased swelling in your face, hands, legs, or ankles.  You are exposed to fifth disease or chickenpox.  You are exposed to [place] measles (rubella) and have never had it. Get help right away if:  You have a fever.  You are leaking fluid from your vagina.  You have spotting or bleeding from your vagina.  You have severe abdominal cramping or pain.  You have rapid weight gain or loss.  You vomit blood or material that looks like coffee grounds.  You develop a severe headache.  You have  shortness of breath.  You have any kind of trauma, such as from a fall or a car accident. Summary  The first trimester of pregnancy is from week 1 until the end of week 13 (months 1 through 3).  Your body goes through many changes during pregnancy. The changes vary from woman to woman.  You will have routine prenatal visits. During those visits, your health care provider will examine you, discuss any test results you may have, and talk with you about how you are feeling. This information is not intended to replace advice given to you by your health care provider. Make sure you discuss any questions you have with your health care provider. Document Revised: 01/05/2017 Document Reviewed: 01/05/2016 Elsevier Patient Education  2020 [employer].

## 2019-10-17 LAB — CULTURE, OB URINE: Culture: <10000 — AB

## 2019-10-17 LAB — GC/CHLAMYDIA PROBE AMP (~~LOC~~) NOT AT ARMC
Chlamydia: NEGATIVE
Comment: NEGATIVE
Comment: NORMAL
Neisseria Gonorrhea: NEGATIVE

## 2019-10-18 ENCOUNTER — Inpatient Hospital Stay (HOSPITAL_COMMUNITY)
Admission: AD | Admit: 2019-10-18 | Discharge: 2019-10-18 | Disposition: A | Discharge status: Home / Self Care | Payer: PRIVATE HEALTH INSURANCE | Attending: Obstetrics & Gynecology | Admitting: Obstetrics & Gynecology

## 2019-10-18 ENCOUNTER — Other Ambulatory Visit: Payer: Self-pay

## 2019-10-18 DIAGNOSIS — Z3A01 Less than 8 weeks gestation of pregnancy: Secondary | ICD-10-CM | POA: Diagnosis not present

## 2019-10-18 DIAGNOSIS — O0281 Inappropriate change in quantitative human chorionic gonadotropin (hCG) in early pregnancy: Secondary | ICD-10-CM

## 2019-10-18 DIAGNOSIS — IMO0002 Reserved for concepts with insufficient information to code with codable children: Secondary | ICD-10-CM

## 2019-10-18 DIAGNOSIS — O3680X0 Pregnancy with inconclusive fetal viability, not applicable or unspecified: Secondary | ICD-10-CM

## 2019-10-18 LAB — HCG, QUANTITATIVE, PREGNANCY: hCG, Beta Chain, Quant, S: 365 m[IU]/mL — ABNORMAL HIGH (ref <5)

## 2019-10-18 NOTE — Discharge Instructions (Signed)

## 2019-10-18 NOTE — [facility] Provider Note (Signed)
History   Chief Complaint:  Follow-up  Laura Rosario is  29 y.o. G2P1001 Patient's last menstrual period was 09/07/2019.Marland Kitchen Patient is here for follow up of quantitative HCG and ongoing surveillance of pregnancy status. She is [redacted]w[redacted]d weeks gestation  by LMP.    Since her last visit, the patient is without new complaint. The patient reports bleeding as  none now.  She denies any pain.  General ROS:  negative  Her previous Quantitative HCG values are:  Results for RUMOR, SUN (MRN [id number]) as of 10/18/2019 15:32  Ref. Range 10/16/2019 10:25  HCG, Beta Chain, Quant, S Latest Ref Range: <5 mIU/mL 262 (H)   Physical Exam   Blood pressure 118/80, pulse 80, temperature 98.6 F (37 C), temperature source Oral, resp. rate 16, last menstrual period 09/07/2019, SpO2 100 %.  Focused Gynecological Exam: examination not indicated  Labs: Results for orders placed or performed during the hospital encounter of 10/18/19 (from the past 24 hour(s))  hCG, quantitative, pregnancy   Collection Time: 10/18/19  2:30 PM  Result Value Ref Range   hCG, Beta Chain, Quant, S 365 (H) <5 mIU/mL   Assessment:   1. Pregnancy of unknown anatomic location   2. Abnormal human chorionic gonadotropin (hCG)   3. [redacted] weeks gestation of pregnancy     Highly desired pregnancy with no pain or bleeding today. Will repeat HCG in am on 9/14. Strict ectopic precautions reviewed at length  Plan: -Discharge home in stable condition -Ectopic precautions discussed -Patient advised to follow-up with [facility] on 9/14, urgent message sent to schedule patient -Patient may return to [facility] as needed or if her condition were to change or worsen  Rolm Bookbinder, CNM 10/18/2019, 3:24 PM

## 2019-10-18 NOTE — [facility] Note (Signed)
Laura Rosario is a 29 y.o. at [redacted]w[redacted]d here in [facility] reporting: here for follow up hcg. States she is still having some left sided back discomfort but it is not as bad as when she was here before, states it is not painful. Is now also having some spotting.  Pain score: 0/10 Vitals:   10/18/19 1416  BP: 118/80  Pulse: 80  Resp: 16  Temp: 98.6 F (37 C)  SpO2: 100%     Lab orders placed from triage: hcg

## 2019-10-21 ENCOUNTER — Other Ambulatory Visit: Payer: PRIVATE HEALTH INSURANCE

## 2019-10-21 ENCOUNTER — Other Ambulatory Visit: Payer: Self-pay

## 2019-10-21 DIAGNOSIS — O3680X0 Pregnancy with inconclusive fetal viability, not applicable or unspecified: Secondary | ICD-10-CM

## 2019-10-22 ENCOUNTER — Inpatient Hospital Stay (HOSPITAL_COMMUNITY): Payer: PRIVATE HEALTH INSURANCE

## 2019-10-22 ENCOUNTER — Other Ambulatory Visit: Payer: Self-pay

## 2019-10-22 ENCOUNTER — Inpatient Hospital Stay (HOSPITAL_COMMUNITY)
Admission: AD | Admit: 2019-10-22 | Discharge: 2019-10-22 | Disposition: A | Discharge status: Home / Self Care | Payer: PRIVATE HEALTH INSURANCE | Attending: Obstetrics and Gynecology | Admitting: Obstetrics and Gynecology

## 2019-10-22 DIAGNOSIS — Z79890 Hormone replacement therapy (postmenopausal): Secondary | ICD-10-CM | POA: Diagnosis not present

## 2019-10-22 DIAGNOSIS — Z3A01 Less than 8 weeks gestation of pregnancy: Secondary | ICD-10-CM | POA: Insufficient documentation

## 2019-10-22 DIAGNOSIS — O00102 Left tubal pregnancy without intrauterine pregnancy: Secondary | ICD-10-CM

## 2019-10-22 DIAGNOSIS — Z79899 Other long term (current) drug therapy: Secondary | ICD-10-CM | POA: Insufficient documentation

## 2019-10-22 DIAGNOSIS — Z87891 Personal history of nicotine dependence: Secondary | ICD-10-CM | POA: Insufficient documentation

## 2019-10-22 LAB — CBC
HCT: 37.2 % (ref 36.0–46.0)
Hemoglobin: 12.4 g/dL (ref 12.0–15.0)
MCH: 30.9 pg (ref 26.0–34.0)
MCHC: 33.3 g/dL (ref 30.0–36.0)
MCV: 92.8 fL (ref 80.0–100.0)
Platelets: 260 10*3/uL (ref 150–400)
RBC: 4.01 MIL/uL (ref 3.87–5.11)
RDW: 12.7 % (ref 11.5–15.5)
WBC: 8.5 10*3/uL (ref 4.0–10.5)
nRBC: 0 % (ref 0.0–0.2)

## 2019-10-22 LAB — COMPREHENSIVE METABOLIC PANEL
ALT: 16 U/L (ref 0–44)
AST: 17 U/L (ref 15–41)
Albumin: 4.4 g/dL (ref 3.5–5.0)
Alkaline Phosphatase: 60 U/L (ref 38–126)
Anion gap: 12 (ref 5–15)
BUN: 11 mg/dL (ref 6–20)
CO2: 19 mmol/L — ABNORMAL LOW (ref 22–32)
Calcium: 9.1 mg/dL (ref 8.9–10.3)
Chloride: 103 mmol/L (ref 98–111)
Creatinine, Ser: 0.75 mg/dL (ref 0.44–1.00)
GFR calc Af Amer: >60 mL/min (ref >60)
GFR calc non Af Amer: >60 mL/min (ref >60)
Glucose, Bld: 93 mg/dL (ref 70–99)
Potassium: 3.9 mmol/L (ref 3.5–5.1)
Sodium: 134 mmol/L — ABNORMAL LOW (ref 135–145)
Total Bilirubin: 0.5 mg/dL (ref 0.3–1.2)
Total Protein: 7.3 g/dL (ref 6.5–8.1)

## 2019-10-22 LAB — BETA HCG QUANT (REF LAB): hCG Quant: 500 m[IU]/mL

## 2019-10-22 LAB — HCG, QUANTITATIVE, PREGNANCY: hCG, Beta Chain, Quant, S: 414 m[IU]/mL — ABNORMAL HIGH (ref <5)

## 2019-10-22 MED ORDER — ACETAMINOPHEN 500 MG PO TABS
1000.0000 mg | ORAL_TABLET | Freq: Once | ORAL | Status: AC
  Administered 2019-10-22: 1000 mg via ORAL
  Filled 2019-10-22: qty 2

## 2019-10-22 MED ORDER — METHOTREXATE FOR ECTOPIC PREGNANCY
50.0000 mg/m2 | Freq: Once | INTRAMUSCULAR | Status: AC
  Administered 2019-10-22: 21:00:00 95 mg via INTRAMUSCULAR
  Filled 2019-10-22: qty 1

## 2019-10-22 NOTE — MAU Note (Signed)
.   Laura Rosario is a 29 y.o. at [redacted]w[redacted]d here in MAU reporting: she was sent here for follow up to rule out ectopic pregnancy. Scant amount of vaginal bleeding. Denies pain at present   LMP: 09/07/19 Onset of complaint: ongoing Pain score: 0 Vitals:   10/22/19 1813  BP: 132/70  Pulse: 88  Resp: 18  Temp: 98.5 F (36.9 C)  SpO2: 100%     FHT: Lab orders placed from triage:

## 2019-10-22 NOTE — Discharge Instructions (Signed)
Methotrexate Treatment for an Ectopic Pregnancy, Care After This sheet gives you information about how to care for yourself after your procedure. Your health care provider may also give you more specific instructions. If you have problems or questions, contact your health care provider. What can I expect after the procedure? After the procedure, it is common to have:  Abdominal cramping.  Vaginal bleeding.  Fatigue.  Nausea.  Vomiting.  Diarrhea. Blood tests will be taken at timed intervals for several days or weeks to check your pregnancy hormone levels. The blood tests will be done until the pregnancy hormone can no longer be detected in the blood. Follow these instructions at home: Activity  Do not have sex until your health care provider approves.  Limit activities that take a lot of effort as told by your health care provider. Medicines  Take over the counter and prescription medicines only as told by your health care provider.  Do not take aspirin, ibuprofen, naproxen, or any other NSAIDs.  Do not take folic acid, prenatal vitamins, or other vitamins that contain folic acid. General instructions   Do not drink alcohol.  Follow instructions from your health care provider on how and when to report any symptoms that may indicate a ruptured ectopic pregnancy.  Keep all follow-up visits as told by your health care provider. This is important. Contact a health care provider if:  You have persistent nausea and vomiting.  You have persistent diarrhea.  You are having a reaction to the medicine, such as: ? Tiredness. ? Skin rash. ? Hair loss. Get help right away if:  Your abdominal or pelvic pain gets worse.  You have more vaginal bleeding.  You feel light-headed or you faint.  You have shortness of breath.  Your heart rate increases.  You develop a cough.  You have chills.  You have a fever. Summary  After the procedure, it is common to have symptoms  of abdominal cramping, vaginal bleeding and fatigue. You may also experience other symptoms.  Blood tests will be taken at timed intervals for several days or weeks to check your pregnancy hormone levels. The blood tests will be done until the pregnancy hormone can no longer be detected in the blood.  Limit strenuous activity as told by your health care provider.  Follow instructions from your health care provider on how and when to report any symptoms that may indicate a ruptured ectopic pregnancy. This information is not intended to replace advice given to you by your health care provider. Make sure you discuss any questions you have with your health care provider. Document Revised: 01/05/2017 Document Reviewed: 03/14/2016 Elsevier Patient Education  2020 Else[employer] Inc[employer]   Ectopic Pregnancy  An ectopic pregnancy happens when a fertilized egg grows outside the womb (uterus). The fertilized egg cannot stay alive outside of the womb. This problem often happens in a fallopian tube. It is often caused by damage to the tube. If this problem is found early, you may be treated with medicine that stops the egg from growing. If your tube tears or bursts open (ruptures), you will bleed inside. Often, there is very bad pain in the lower belly. This is an emergency. You will need surgery. Get help right away. Follow these instructions at home: After being treated with medicine or surgery:  Rest and limit your activity for as long as told by your doctor.  Until your doctor says that it is safe: ? Do not lift anything that is heavier than   10 lb (4.5 kg) or the limit that your doctor tells you. ? Avoid exercise and any movement that takes a lot of effort.  To prevent problems when pooping (constipation): ? Eat a healthy diet. This includes:  Fruits.  Vegetables.  Whole grains. ? Drink 6-8 glasses of water a day. Contact a doctor if: Get help right away if:  You have sudden and very bad pain in  your belly.  You have very bad pain in your shoulders or neck.  You have pain that gets worse and is not helped by medicine.  You have: ? A fever or chills. ? Vaginal bleeding. ? Redness or swelling at the site of a surgical cut (incision).  You feel sick to your stomach (nauseous) or you throw up (vomit).  You feel dizzy or weak.  You feel light-headed or you pass out (faint). Summary  An ectopic pregnancy happens when a fertilized egg grows outside the womb (uterus).  If this problem is found early, you may be treated with medicine that stops the egg from growing.  If your tube tears or bursts open (ruptures), you will need surgery. This is an emergency. Get help right away. This information is not intended to replace advice given to you by your health care provider. Make sure you discuss any questions you have with your health care provider. Document Revised: 01/05/2017 Document Reviewed: 02/17/2016 Elsevier Patient Education  2020 Elsevier Inc.  

## 2019-10-22 NOTE — MAU Note (Signed)
Discharge instructions reviewed with patient.

## 2019-10-22 NOTE — MAU Provider Note (Signed)
History     CSN: [id number]  Arrival date and time: 10/22/19 1711   None     Chief Complaint  Patient presents with  . Pregnancy Ultrasound   Laura Rosario is a 29 y.o. G2P1001 at [redacted]w[redacted]d who presents today with LLQ pain, and she was seen in the office with HCG levels that are not rising as expected. She states that yesterday she had a lot pain, but today it is not as bad.   Pelvic Pain The patient's primary symptoms include pelvic pain and vaginal bleeding. This is a new problem. The current episode started in the past 7 days. The problem occurs constantly. The problem has been gradually worsening. The problem affects the left side. She is pregnant. The vaginal bleeding is lighter than menses. Nothing aggravates the symptoms. She has tried nothing for the symptoms.    OB History    Gravida  2   Para  1   Term  1   Preterm      AB      Living  1     SAB      TAB      Ectopic      Multiple      Live Births              Past Medical History:  Diagnosis Date  . Abnormal Pap smear    HPV  . Asthma   . BV (bacterial vaginosis)   . Contraceptive management 08/22/2013  . Herpes simplex without mention of complication   . Vaginal Pap smear, abnormal     Past Surgical History:  Procedure Laterality Date  . CESAREAN SECTION      Family History  Problem Relation Age of Onset  . Cancer Father        Lung  . Hypertension Father   . Iron deficiency Mother   . Thyroid disease Mother   . Diabetes Paternal Grandmother   . Heart attack Maternal Grandfather   . Diabetes Paternal Grandfather     Social History   Tobacco Use  . Smoking status: Former Smoker    Packs/day: 0.02    Years: 2.00    Pack years: 0.04    Types: Cigarettes    Quit date: 05/02/2010    Years since quitting: 9.4  . Smokeless tobacco: Never Used  Vaping Use  . Vaping Use: Never used  Substance Use Topics  . Alcohol use: Not Currently    Alcohol/week: 1.0 standard drink     Types: 1 Glasses of wine per week    Comment: ocassional 2time a month  . Drug use: Yes    Types: Marijuana    Comment: late august 2021    Allergies: No Known Allergies  Medications Prior to Admission  Medication Sig Dispense Refill Last Dose  . acetaminophen (TYLENOL) 500 MG tablet Take 500 mg by mouth every 6 (six) hours as needed.     . cyclobenzaprine (FLEXERIL) 10 MG tablet Take 1 tablet (10 mg total) by mouth 3 (three) times daily as needed. 30 tablet 0   . levothyroxine (SYNTHROID) 150 MCG tablet Take 1 tablet (150 mcg total) by mouth daily before breakfast. 30 tablet 3     Review of Systems  Genitourinary: Positive for pelvic pain.  All other systems reviewed and are negative.  Physical Exam   Blood pressure 132/70, pulse 88, temperature 98.5 F (36.9 C), resp. rate 18, weight 82.6 kg, last menstrual period 09/07/2019, SpO2 100 %.  Physical Exam Vitals and nursing note reviewed.  HENT:     Head: Normocephalic.  Cardiovascular:     Rate and Rhythm: Normal rate.  Pulmonary:     Effort: Pulmonary effort is normal.  Abdominal:     Palpations: Abdomen is soft.  Skin:    General: Skin is warm and dry.  Neurological:     Mental Status: She is alert and oriented to person, place, and time.  Psychiatric:        Mood and Affect: Mood normal.        Behavior: Behavior normal.      US OB Transvaginal  Result Date: 10/22/2019 CLINICAL DATA:  Evaluate pregnancy of unknown location. Inappropriate elevation of beta HCG. EXAM: TRANSVAGINAL OB ULTRASOUND TECHNIQUE: Transvaginal ultrasound was performed for complete evaluation of the gestation as well as the maternal uterus, adnexal regions, and pelvic cul-de-sac. COMPARISON:  10/16/2019. FINDINGS: Intrauterine gestational sac: None Yolk sac:  Not Visualized. Embryo:  Not Visualized. Maternal uterus/adnexae: Right ovary: Appears normal Left ovary: Appears normal containing corpus luteum Other :Within the left adnexa there is a  mass superior and medial to the left ovary which measures 1.6 x 1.6 x 1.5 cm. This appears to contain a gestational sac and small yolk sac but no fetal pole.Thickened and heterogeneous endometrium measures up to 1.3 cm Free fluid:  No free fluid identified IMPRESSION: 1. Mass within the left adnexa, separate from the ovary is noted which contains a small gestational sac and yolk sac but no fetal pole. Findings are consistent with ectopic pregnancy. 2. Critical Value/emergent results were called by telephone at the time of interpretation on 10/22/2019 at 6:58 pm to provider [facility] , who verbally acknowledged these results. Electronically Signed   By: Signa Kell M.D.   On: 10/22/2019 18:59   Results for orders placed or performed during the hospital encounter of 10/22/19 (from the past 24 hour(s))  CBC     Status: None   Collection Time: 10/22/19  7:25 PM  Result Value Ref Range   WBC 8.5 4.0 - 10.5 K/uL   RBC 4.01 3.87 - 5.11 MIL/uL   Hemoglobin 12.4 12.0 - 15.0 g/dL   HCT 78.2 36 - 46 %   MCV 92.8 80.0 - 100.0 fL   MCH 30.9 26.0 - 34.0 pg   MCHC 33.3 30.0 - 36.0 g/dL   RDW 95.6 21.3 - 08.6 %   Platelets 260 150 - 400 K/uL   nRBC 0.0 0.0 - 0.2 %  Comprehensive metabolic panel     Status: Abnormal   Collection Time: 10/22/19  7:25 PM  Result Value Ref Range   Sodium 134 (L) 135 - 145 mmol/L   Potassium 3.9 3.5 - 5.1 mmol/L   Chloride 103 98 - 111 mmol/L   CO2 19 (L) 22 - 32 mmol/L   Glucose, Bld 93 70 - 99 mg/dL   BUN 11 6 - 20 mg/dL   Creatinine, Ser 5.78 0.44 - 1.00 mg/dL   Calcium 9.1 8.9 - 46.9 mg/dL   Total Protein 7.3 6.5 - 8.1 g/dL   Albumin 4.4 3.5 - 5.0 g/dL   AST 17 15 - 41 U/L   ALT 16 0 - 44 U/L   Alkaline Phosphatase 60 38 - 126 U/L   Total Bilirubin 0.5 0.3 - 1.2 mg/dL   GFR calc non Af Amer >60 >60 mL/min   GFR calc Af Amer >60 >60 mL/min   Anion gap 12 5 - 15  hCG, quantitative, pregnancy     Status: Abnormal   Collection Time: 10/22/19  7:25 PM  Result  Value Ref Range   hCG, Beta Chain, Quant, S 414 (H) <5 mIU/mL    MAU Course  Procedures  MDM 8:35 PM DW Dr. Alysia Penna and reviewed history/physical and [place] results. Plan to give MTX at this time. FU as planned with MTX protocol.   Assessment and Plan   1. Left tubal pregnancy without intrauterine pregnancy    MTX given today  DC home Ectopic precautions RX: none  FU on Sturday for day #4 MTX labs and follow up or sooner for emergencies or change in status or pain levels    Follow-up Information    [facility] Follow up.   Specialty: Obstetrics and Gynecology Why: Return Saturday 9/18 for day #4 labs  Contact information: [address] [id number] mc [address] [place] [address] [phone]               Thressa Sheller DNP, CNM  10/22/19  7:41 PM

## 2019-10-25 ENCOUNTER — Encounter (HOSPITAL_COMMUNITY): Payer: Self-pay | Admitting: Obstetrics and Gynecology

## 2019-10-25 ENCOUNTER — Inpatient Hospital Stay (HOSPITAL_COMMUNITY): Payer: PRIVATE HEALTH INSURANCE | Admitting: Certified Registered Nurse Anesthetist

## 2019-10-25 ENCOUNTER — Encounter (HOSPITAL_COMMUNITY)
Admission: AD | Disposition: A | Discharge status: Home / Self Care | Payer: Self-pay | Source: Home / Self Care | Attending: Obstetrics and Gynecology

## 2019-10-25 ENCOUNTER — Other Ambulatory Visit: Payer: Self-pay

## 2019-10-25 ENCOUNTER — Inpatient Hospital Stay (HOSPITAL_COMMUNITY)
Admission: AD | Admit: 2019-10-25 | Discharge: 2019-10-25 | Disposition: A | Discharge status: Home / Self Care | Payer: PRIVATE HEALTH INSURANCE | Attending: Obstetrics and Gynecology | Admitting: Obstetrics and Gynecology

## 2019-10-25 DIAGNOSIS — O009 Unspecified ectopic pregnancy without intrauterine pregnancy: Secondary | ICD-10-CM

## 2019-10-25 DIAGNOSIS — Z20822 Contact with and (suspected) exposure to covid-19: Secondary | ICD-10-CM | POA: Diagnosis not present

## 2019-10-25 DIAGNOSIS — J45909 Unspecified asthma, uncomplicated: Secondary | ICD-10-CM | POA: Insufficient documentation

## 2019-10-25 DIAGNOSIS — O99519 Diseases of the respiratory system complicating pregnancy, unspecified trimester: Secondary | ICD-10-CM | POA: Diagnosis not present

## 2019-10-25 DIAGNOSIS — Z87891 Personal history of nicotine dependence: Secondary | ICD-10-CM | POA: Insufficient documentation

## 2019-10-25 DIAGNOSIS — Z9889 Other specified postprocedural states: Secondary | ICD-10-CM

## 2019-10-25 DIAGNOSIS — O26899 Other specified pregnancy related conditions, unspecified trimester: Secondary | ICD-10-CM | POA: Insufficient documentation

## 2019-10-25 DIAGNOSIS — O00102 Left tubal pregnancy without intrauterine pregnancy: Secondary | ICD-10-CM | POA: Diagnosis present

## 2019-10-25 DIAGNOSIS — K661 Hemoperitoneum: Secondary | ICD-10-CM | POA: Diagnosis not present

## 2019-10-25 HISTORY — PX: DIAGNOSTIC LAPAROSCOPY WITH REMOVAL OF ECTOPIC PREGNANCY: SHX6449

## 2019-10-25 LAB — COMPREHENSIVE METABOLIC PANEL
ALT: 18 U/L (ref 0–44)
AST: 20 U/L (ref 15–41)
Albumin: 4 g/dL (ref 3.5–5.0)
Alkaline Phosphatase: 80 U/L (ref 38–126)
Anion gap: 10 (ref 5–15)
BUN: 16 mg/dL (ref 6–20)
CO2: 20 mmol/L — ABNORMAL LOW (ref 22–32)
Calcium: 9.7 mg/dL (ref 8.9–10.3)
Chloride: 109 mmol/L (ref 98–111)
Creatinine, Ser: 0.89 mg/dL (ref 0.44–1.00)
GFR calc Af Amer: >60 mL/min (ref >60)
GFR calc non Af Amer: >60 mL/min (ref >60)
Glucose, Bld: 91 mg/dL (ref 70–99)
Potassium: 3.8 mmol/L (ref 3.5–5.1)
Sodium: 139 mmol/L (ref 135–145)
Total Bilirubin: 0.5 mg/dL (ref 0.3–1.2)
Total Protein: 7.1 g/dL (ref 6.5–8.1)

## 2019-10-25 LAB — CBC
HCT: 39.1 % (ref 36.0–46.0)
Hemoglobin: 13 g/dL (ref 12.0–15.0)
MCH: 31 pg (ref 26.0–34.0)
MCHC: 33.2 g/dL (ref 30.0–36.0)
MCV: 93.1 fL (ref 80.0–100.0)
Platelets: 270 10*3/uL (ref 150–400)
RBC: 4.2 MIL/uL (ref 3.87–5.11)
RDW: 12.9 % (ref 11.5–15.5)
WBC: 11 10*3/uL — ABNORMAL HIGH (ref 4.0–10.5)
nRBC: 0 % (ref 0.0–0.2)

## 2019-10-25 LAB — SARS CORONAVIRUS 2 BY RT PCR (HOSPITAL ORDER, PERFORMED IN ~~LOC~~ HOSPITAL LAB): SARS Coronavirus 2: NEGATIVE

## 2019-10-25 LAB — TYPE AND SCREEN
ABO/RH(D): O POS
Antibody Screen: NEGATIVE

## 2019-10-25 LAB — HCG, QUANTITATIVE, PREGNANCY: hCG, Beta Chain, Quant, S: 259 m[IU]/mL — ABNORMAL HIGH (ref <5)

## 2019-10-25 SURGERY — LAPAROSCOPY, WITH ECTOPIC PREGNANCY SURGICAL TREATMENT
Anesthesia: General | Laterality: Left

## 2019-10-25 MED ORDER — ONDANSETRON HCL 4 MG/2ML IJ SOLN
INTRAMUSCULAR | Status: DC | PRN
  Administered 2019-10-25: 4 mg via INTRAVENOUS

## 2019-10-25 MED ORDER — DEXAMETHASONE SODIUM PHOSPHATE 10 MG/ML IJ SOLN
INTRAMUSCULAR | Status: DC | PRN
  Administered 2019-10-25: 10 mg via INTRAVENOUS

## 2019-10-25 MED ORDER — ROCURONIUM BROMIDE 10 MG/ML (PF) SYRINGE
PREFILLED_SYRINGE | INTRAVENOUS | Status: DC | PRN
  Administered 2019-10-25: 40 mg via INTRAVENOUS

## 2019-10-25 MED ORDER — LIDOCAINE 2% (20 MG/ML) 5 ML SYRINGE
INTRAMUSCULAR | Status: DC | PRN
  Administered 2019-10-25: 100 mg via INTRAVENOUS

## 2019-10-25 MED ORDER — MIDAZOLAM HCL 2 MG/2ML IJ SOLN
INTRAMUSCULAR | Status: AC
  Filled 2019-10-25: qty 2

## 2019-10-25 MED ORDER — MEPERIDINE HCL 25 MG/ML IJ SOLN: 6.2500 mg | INTRAMUSCULAR | Status: DC | PRN

## 2019-10-25 MED ORDER — LACTATED RINGERS IV SOLN: INTRAVENOUS | Status: DC

## 2019-10-25 MED ORDER — OXYCODONE HCL 5 MG PO TABS: 5.0000 mg | ORAL_TABLET | ORAL | 0 refills | Status: DC | PRN

## 2019-10-25 MED ORDER — PROMETHAZINE HCL 25 MG/ML IJ SOLN: 6.2500 mg | INTRAMUSCULAR | Status: DC | PRN

## 2019-10-25 MED ORDER — CHLORHEXIDINE GLUCONATE 0.12 % MT SOLN
OROMUCOSAL | Status: AC
  Administered 2019-10-25: 15 mL via OROMUCOSAL
  Filled 2019-10-25: qty 15

## 2019-10-25 MED ORDER — DIPHENHYDRAMINE HCL 50 MG/ML IJ SOLN
INTRAMUSCULAR | Status: DC | PRN
  Administered 2019-10-25: 12.5 mg via INTRAVENOUS

## 2019-10-25 MED ORDER — SODIUM CHLORIDE 0.9 % IR SOLN
Status: DC | PRN
  Administered 2019-10-25: 3000 mL

## 2019-10-25 MED ORDER — PROPOFOL 10 MG/ML IV BOLUS
INTRAVENOUS | Status: DC | PRN
  Administered 2019-10-25: 200 mg via INTRAVENOUS

## 2019-10-25 MED ORDER — KETOROLAC TROMETHAMINE 30 MG/ML IJ SOLN
INTRAMUSCULAR | Status: DC | PRN
  Administered 2019-10-25: 30 mg via INTRAVENOUS

## 2019-10-25 MED ORDER — CHLORHEXIDINE GLUCONATE 0.12 % MT SOLN: 15.0000 mL | Freq: Once | OROMUCOSAL | Status: AC

## 2019-10-25 MED ORDER — SUGAMMADEX SODIUM 200 MG/2ML IV SOLN
INTRAVENOUS | Status: DC | PRN
  Administered 2019-10-25: 200 mg via INTRAVENOUS

## 2019-10-25 MED ORDER — FENTANYL CITRATE (PF) 250 MCG/5ML IJ SOLN
INTRAMUSCULAR | Status: DC | PRN
  Administered 2019-10-25 (×4): 50 ug via INTRAVENOUS

## 2019-10-25 MED ORDER — BUPIVACAINE HCL 0.5 % IJ SOLN
INTRAMUSCULAR | Status: DC | PRN
  Administered 2019-10-25: 20 mL

## 2019-10-25 MED ORDER — KETOROLAC TROMETHAMINE 30 MG/ML IJ SOLN: 30.0000 mg | Freq: Once | INTRAMUSCULAR | Status: DC | PRN

## 2019-10-25 MED ORDER — FENTANYL CITRATE (PF) 100 MCG/2ML IJ SOLN
INTRAMUSCULAR | Status: AC
Start: 2019-10-25 — End: 2019-10-25
  Administered 2019-10-25: 100 ug via INTRAVENOUS
  Filled 2019-10-25: qty 2

## 2019-10-25 MED ORDER — DOCUSATE SODIUM 100 MG PO CAPS: 100.0000 mg | ORAL_CAPSULE | Freq: Two times a day (BID) | ORAL | 2 refills | Status: DC | PRN

## 2019-10-25 MED ORDER — PROPOFOL 10 MG/ML IV BOLUS
INTRAVENOUS | Status: AC
  Filled 2019-10-25: qty 20

## 2019-10-25 MED ORDER — SUCCINYLCHOLINE CHLORIDE 200 MG/10ML IV SOSY
PREFILLED_SYRINGE | INTRAVENOUS | Status: DC | PRN
  Administered 2019-10-25: 120 mg via INTRAVENOUS

## 2019-10-25 MED ORDER — FENTANYL CITRATE (PF) 250 MCG/5ML IJ SOLN
INTRAMUSCULAR | Status: AC
  Filled 2019-10-25: qty 5

## 2019-10-25 MED ORDER — HYDROMORPHONE HCL 1 MG/ML IJ SOLN: 0.2500 mg | INTRAMUSCULAR | Status: DC | PRN

## 2019-10-25 MED ORDER — FENTANYL CITRATE (PF) 100 MCG/2ML IJ SOLN: 100.0000 ug | Freq: Once | INTRAMUSCULAR | Status: AC

## 2019-10-25 MED ORDER — MIDAZOLAM HCL 2 MG/2ML IJ SOLN
INTRAMUSCULAR | Status: DC | PRN
  Administered 2019-10-25: 2 mg via INTRAVENOUS

## 2019-10-25 MED ORDER — HYDROMORPHONE HCL 1 MG/ML IJ SOLN
1.0000 mg | Freq: Once | INTRAMUSCULAR | Status: AC
  Administered 2019-10-25: 1 mg via INTRAVENOUS
  Filled 2019-10-25: qty 1

## 2019-10-25 SURGICAL SUPPLY — 44 items
ADH SKN CLS APL DERMABOND .7 (GAUZE/BANDAGES/DRESSINGS) ×1
APL SWBSTK 6 STRL LF DISP (MISCELLANEOUS) ×1
APPLICATOR COTTON TIP 6 STRL (MISCELLANEOUS) ×1 IMPLANT
APPLICATOR COTTON TIP 6IN STRL (MISCELLANEOUS) ×3
BAG SPEC RTRVL LRG 6X4 10 (ENDOMECHANICALS) ×1
BLADE SURG 15 STRL LF DISP TIS (BLADE) ×1 IMPLANT
BLADE SURG 15 STRL SS (BLADE) ×3
CABLE HIGH FREQUENCY MONO STRZ (ELECTRODE) IMPLANT
DEFOGGER SCOPE WARMER CLEARIFY (MISCELLANEOUS) ×3 IMPLANT
DERMABOND ADVANCED (GAUZE/BANDAGES/DRESSINGS) ×2
DERMABOND ADVANCED .7 DNX12 (GAUZE/BANDAGES/DRESSINGS) ×1 IMPLANT
DRSG OPSITE POSTOP 3X4 (GAUZE/BANDAGES/DRESSINGS) ×3 IMPLANT
DURAPREP 26ML APPLICATOR (WOUND CARE) ×3 IMPLANT
ELECT REM PT RETURN 9FT ADLT (ELECTROSURGICAL) ×3
ELECTRODE REM PT RTRN 9FT ADLT (ELECTROSURGICAL) ×1 IMPLANT
GLOVE BIOGEL PI IND STRL 7.0 (GLOVE) ×2 IMPLANT
GLOVE BIOGEL PI IND STRL 7.5 (GLOVE) ×2 IMPLANT
GLOVE BIOGEL PI INDICATOR 7.0 (GLOVE) ×4
GLOVE BIOGEL PI INDICATOR 7.5 (GLOVE) ×4
GLOVE SURG SS PI 7.0 STRL IVOR (GLOVE) ×3 IMPLANT
GOWN STRL REUS W/ TWL LRG LVL3 (GOWN DISPOSABLE) ×3 IMPLANT
GOWN STRL REUS W/TWL LRG LVL3 (GOWN DISPOSABLE) ×9
KIT TURNOVER KIT B (KITS) ×3 IMPLANT
LIGASURE VESSEL 5MM BLUNT TIP (ELECTROSURGICAL) ×2 IMPLANT
NS IRRIG 1000ML POUR BTL (IV SOLUTION) ×3 IMPLANT
PACK LAPAROSCOPY BASIN (CUSTOM PROCEDURE TRAY) ×3 IMPLANT
PACK TRENDGUARD 450 HYBRID PRO (MISCELLANEOUS) IMPLANT
PAD OB MATERNITY 4.3X12.25 (PERSONAL CARE ITEMS) ×3 IMPLANT
POUCH LAPAROSCOPIC INSTRUMENT (MISCELLANEOUS) ×3 IMPLANT
POUCH SPECIMEN RETRIEVAL 10MM (ENDOMECHANICALS) ×2 IMPLANT
PROTECTOR NERVE ULNAR (MISCELLANEOUS) ×6 IMPLANT
SCISSORS LAP 5X35 DISP (ENDOMECHANICALS) IMPLANT
SET IRRIG TUBING LAPAROSCOPIC (IRRIGATION / IRRIGATOR) ×2 IMPLANT
SET TUBE SMOKE EVAC HIGH FLOW (TUBING) ×3 IMPLANT
SLEEVE ADV FIXATION 5X100MM (TROCAR) ×2 IMPLANT
SUT MON AB 4-0 PS1 27 (SUTURE) ×3 IMPLANT
SUT VICRYL 0 UR6 27IN ABS (SUTURE) ×3 IMPLANT
SYR 10ML LL (SYRINGE) ×3 IMPLANT
TOWEL GREEN STERILE FF (TOWEL DISPOSABLE) ×6 IMPLANT
TRAY FOLEY W/BAG SLVR 14FR (SET/KITS/TRAYS/PACK) ×3 IMPLANT
TRENDGUARD 450 HYBRID PRO PACK (MISCELLANEOUS) ×3
TROCAR ADV FIXATION 5X100MM (TROCAR) ×4 IMPLANT
TROCAR BALLN 12MMX100 BLUNT (TROCAR) ×3 IMPLANT
WARMER LAPAROSCOPE (MISCELLANEOUS) ×3 IMPLANT

## 2019-10-25 NOTE — MAU Note (Signed)
Pt reports sudden lower left abd pain this morning.  Light bleeding reported.  Pt also complains of right shoulder pain and numbness in her left hand.

## 2019-10-25 NOTE — Anesthesia Preprocedure Evaluation (Addendum)
Anesthesia Evaluation  Patient identified by MRN, date of birth, ID band Patient awake    Reviewed: Allergy & Precautions, NPO status , Patient's Chart, lab work & pertinent test results  Airway Mallampati: II  TM Distance: >3 FB Neck ROM: Full    Dental no notable dental hx. (+) Dental Advisory Given, Teeth Intact   Pulmonary asthma , former smoker,    Pulmonary exam normal breath sounds clear to auscultation       Cardiovascular negative cardio ROS Normal cardiovascular exam Rhythm:Regular Rate:Normal     Neuro/Psych negative neurological ROS     GI/Hepatic negative GI ROS, Neg liver ROS,   Endo/Other  negative endocrine ROS  Renal/GU negative Renal ROS     Musculoskeletal negative musculoskeletal ROS (+)   Abdominal (+) + obese,   Peds  Hematology negative hematology ROS (+)   Anesthesia Other Findings   Reproductive/Obstetrics                            Anesthesia Physical Anesthesia Plan  ASA: II  Anesthesia Plan: General   Post-op Pain Management:    Induction: Intravenous  PONV Risk Score and Plan: 4 or greater and Ondansetron, Dexamethasone, Treatment may vary due to age or medical condition, Midazolam and Scopolamine patch - Pre-op  Airway Management Planned: Oral ETT  Additional Equipment: None  Intra-op Plan:   Post-operative Plan: Extubation in OR  Informed Consent: I have reviewed the patients History and Physical, chart, labs and discussed the procedure including the risks, benefits and alternatives for the proposed anesthesia with the patient or authorized representative who has indicated his/her understanding and acceptance.     Dental advisory given  Plan Discussed with: CRNA  Anesthesia Plan Comments:         Anesthesia Quick Evaluation

## 2019-10-25 NOTE — Anesthesia Procedure Notes (Signed)
Procedure Name: Intubation Date/Time: 10/25/2019 2:23 PM Performed by: Clearnce Sorrel, CRNA Pre-anesthesia Checklist: Patient identified, Emergency Drugs available, Suction available, Patient being monitored and Timeout performed Patient Re-evaluated:Patient Re-evaluated prior to induction Oxygen Delivery Method: Circle system utilized Preoxygenation: Pre-oxygenation with 100% oxygen Induction Type: IV induction, Rapid sequence and Cricoid Pressure applied Laryngoscope Size: Mac and 3 Grade View: Grade I Tube type: Oral Tube size: 7.0 mm Number of attempts: 1 Airway Equipment and Method: Stylet Placement Confirmation: ETT inserted through vocal cords under direct vision,  positive ETCO2 and breath sounds checked- equal and bilateral Secured at: 22 cm Tube secured with: Tape Dental Injury: Teeth and Oropharynx as per pre-operative assessment

## 2019-10-25 NOTE — Progress Notes (Signed)
Spoke to Dr. Vergie Living and no preop antibiotic needed.

## 2019-10-25 NOTE — Op Note (Signed)
Operative Note   10/25/2019  PRE-OP DIAGNOSIS *Left Ectopic Pregnancy    POST-OP DIAGNOSIS *Same   SURGEON: Surgeon(s) and Role:    Vance Bing, MD - Primary  ASSISTANT: None  PROCEDURE: laparoscopic left salpingectomy  ANESTHESIA: General and local  ESTIMATED BLOOD LOSS: 76mL  DRAINS: indwelling foley UOP per anesthesia note   TOTAL IV FLUIDS: per anesthesia note  VTE PROPHYLAXIS: SCDs to the bilateral lower extremities  ANTIBIOTICS: not indicated  SPECIMENS: left fallopian tube  DISPOSITION: PACU - hemodynamically stable.  CONDITION: stable  COMPLICATIONS: Initial ligasure did not work so replacement had to be obtained  FINDINGS: grossly normal uterus, bilateral ovaries, right fallopian tube, liver and stomach edge. Scant adhesions on the left pelvic brim holding some large bowel near the left IP ligament but well away from usual trocar insertion site for pelvic surgery and the operative field.  Engorged left fallopain tube. Small volume (43mL) of hemoperitoneum already present. Normal EGBUS and cervix.   DESCRIPTION OF PROCEDURE: After informed consent was obtained, the patient was taken to the operating room where anesthesia was obtained without difficulty. The patient was positioned in the dorsal lithotomy position in [place] stirrups and her arms were carefully tucked at her sides and the usual precautions were taken.  She was prepped and draped in normal sterile fashion.  Time-out was performed and a Foley catheter was placed into the bladder. A standard [place] uterine manipulator was then placed in the uterus without incident. Gloves were then changed, and after injection of local anesthesia, a 78mm skin incision was made at the umbilicus and the laparoscope was introduced visually using the excel trocar. Abdomen insufflated to and entry reviewed and no evidence injury with above noted findings seen. Patient placed into Trendelenburg and 36mm right and 7mm  suprapubic trocar placed after injection of local anesthesia and under direct visualization. Left mesosalpingx serially cauterized and cut with the Ligasure at the cornua. Specimen placed into endocatch bag and removed from the abdomen. Suction used to removed small amount of hemoperitoneum, approximately 80mL. Gas lowered to and all operative site hemostatic. Abdomen re insuffluated to and suprapubic trocar removed and Shellia Carwin II device used to close fascia with 0-vicryl. LLQ port removed under direct visualization and gas released. Blunt probe inserted and then umbilical port removed.   The skin incision at the umbilicus and suprapubic site were closed with a subcuticular stitch of 4-0 monocryl.  All incisions were then closed with dermabond. Hulka removed and speculum placed and cervix normal and hemostatic.   The patient tolerated the procedure well.  Sponge, lap and needle counts were correct x2.  The patient was taken to recovery room in excellent condition.  Cornelia Copa MD Attending [facility] [employer] [facility])

## 2019-10-25 NOTE — Transfer of Care (Signed)
Immediate Anesthesia Transfer of Care Note  Patient: Laura Rosario  Procedure(s) Performed: DIAGNOSTIC LAPAROSCOPY WITH REMOVAL OF ECTOPIC PREGNANCY (Left )  Patient Location: PACU  Anesthesia Type:General  Level of Consciousness: awake, alert  and oriented  Airway & Oxygen Therapy: Patient Spontanous Breathing  Post-op Assessment: Report given to RN and Post -op Vital signs reviewed and stable  Post vital signs: Reviewed and stable  Last Vitals:  Vitals Value Taken Time  BP 114/50 10/25/19 1542  Temp    Pulse 107 10/25/19 1543  Resp 19 10/25/19 1543  SpO2 99 % 10/25/19 1543  Vitals shown include unvalidated device data.  Last Pain:  Vitals:   10/25/19 1346  TempSrc:   PainSc: 5       Patients Stated Pain Goal: 0 (10/25/19 1307)  Complications: No complications documented.

## 2019-10-25 NOTE — H&P (Signed)
Obstetrics & Gynecology H&P   Date of Admission: 10/25/2019   Requesting Provider: Maternity Admissions Unit  Primary OBG[facility]: [facility] Primary Care Provider: Benita Stabile  Reason for Admission: day four labs after methotrexate. llq pain  History of Present Illness: Laura Rosario is a 29 y.o. G2P1001 (Patient's last menstrual period was 09/07/2019.), with the above CC. PMHx is significant for h/o c-section x 1.  Pt dx with left ectopic pregnancy on 9/15 and was given methotrexate. She is here today for day for beta hcg left and has moderate abdominal pain. Npo since mn    ROS: A 12-point review of systems was performed and negative, except as stated in the above HPI.  OBGYN History: As per HPI. OB History  Gravida Para Term Preterm AB Living  2 1 1     1   SAB TAB Ectopic Multiple Live Births               # Outcome Date GA Lbr Len/2nd Weight Sex Delivery Anes PTL Lv  2 Current           1 Term 10/15/17 [redacted]w[redacted]d    CS-Unspec        Past Medical History: Past Medical History:  Diagnosis Date  . Abnormal Pap smear    HPV  . Asthma   . Contraceptive management 08/22/2013  . Herpes simplex without mention of complication   . Vaginal Pap smear, abnormal     Past Surgical History: Past Surgical History:  Procedure Laterality Date  . CESAREAN SECTION      Family History:  Family History  Problem Relation Age of Onset  . Cancer Father        Lung  . Hypertension Father   . Iron deficiency Mother   . Thyroid disease Mother   . Diabetes Paternal Grandmother   . Heart attack Maternal Grandfather   . Diabetes Paternal Grandfather     Social History:  Social History   Socioeconomic History  . Marital status: Legally Separated    Spouse name: Not on file  . Number of children: Not on file  . Years of education: Not on file  . Highest education level: Not on file  Occupational History  . 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[employer]   Tobacco Use  . Smoking status: Former Smoker     Packs/day: 0.02    Years: 2.00    Pack years: 0.04    Types: Cigarettes    Quit date: 05/02/2010    Years since quitting: 9.4  . Smokeless tobacco: Never Used  Vaping Use  . Vaping Use: Never used  Substance and Sexual Activity  . Alcohol use: Not Currently    Alcohol/week: 1.0 standard drink    Types: 1 Glasses of wine per week    Comment: ocassional 2time a month  . Drug use: Yes    Types: Marijuana    Comment: late august 2021  . Sexual activity: Yes    Birth control/protection: Condom  Other Topics Concern  . Not on file  Social History Narrative  . Not on file   Social Determinants of Health   Financial Resource Strain: Low Risk   . Difficulty of Paying Living Expenses: Not hard at all  Food Insecurity: No Food Insecurity  . Worried About September 2021 in the Last Year: Never true  . Ran Out of Food in the Last Year: Never true  Transportation Needs: No Transportation Needs  . Lack of Transportation (Medical):  No  . Lack of Transportation (Non-Medical): No  Physical Activity: Insufficiently Active  . Days of Exercise per Week: 1 day  . Minutes of Exercise per Session: 30 min  Stress: No Stress Concern Present  . Feeling of Stress : Only a little  Social Connections: Moderately Isolated  . Frequency of Communication with Friends and Family: More than three times a week  . Frequency of Social Gatherings with Friends and Family: Three times a week  . Attends Religious Services: Never  . Active Member of Clubs or Organizations: No  . Attends Banker Meetings: Never  . Marital Status: Living with partner  Intimate Partner Violence: Not At Risk  . Fear of Current or Ex-Partner: No  . Emotionally Abused: No  . Physically Abused: No  . Sexually Abused: No    Allergy: No Known Allergies  Current Outpatient Medications: Medications Prior to Admission  Medication Sig Dispense Refill Last Dose  . acetaminophen (TYLENOL) 500 MG tablet Take 500 mg  by mouth every 6 (six) hours as needed.     . cyclobenzaprine (FLEXERIL) 10 MG tablet Take 1 tablet (10 mg total) by mouth 3 (three) times daily as needed. 30 tablet 0   . levothyroxine (SYNTHROID) 150 MCG tablet Take 1 tablet (150 mcg total) by mouth daily before breakfast. 30 tablet 3      Hospital Medications: No current facility-administered medications for this encounter.     Physical Exam: Vitals:   10/25/19 0936 10/25/19 0937  BP:  120/71  Pulse:  87  Temp:  98 F (36.7 C)  TempSrc:  Oral  SpO2: 98%     Temp:  [98 F (36.7 C)] 98 F (36.7 C) (09/18 0937) Pulse Rate:  [87] 87 (09/18 0937) BP: (120)/(71) 120/71 (09/18 0937) SpO2:  [98 %] 98 % (09/18 0936) No intake/output data recorded. No intake/output data recorded. No intake or output data in the 24 hours ending 10/25/19 0948   Current Vital Signs 24h Vital Sign Ranges  T 98 F (36.7 C) Temp  Avg: 98 F (36.7 C)  Min: 98 F (36.7 C)  Max: 98 F (36.7 C)  BP 120/71 BP  Min: 120/71  Max: 120/71  HR 87 Pulse  Avg: 87  Min: 87  Max: 87  RR   No data recorded  SaO2 98 %   SpO2  Avg: 98 %  Min: 98 %  Max: 98 %       24 Hour I/O Current Shift I/O  Time Ins Outs No intake/output data recorded. No intake/output data recorded.   Patient Vitals for the past 24 hrs:  BP Temp Temp src Pulse SpO2  10/25/19 0937 120/71 98 F (36.7 C) Oral 87 --  10/25/19 0936 -- -- -- -- 98 %    There is no height or weight on file to calculate BMI. General appearance: Well nourished, well developed female in no acute distress.  Cardiovascular: S1, S2 normal, no murmur, rub or gallop, regular rate and rhythm Respiratory:  Clear to auscultation bilateral. Normal respiratory effort Abdomen: soft, nd. Moderately ttp in llq, no peritoneal s/s.  Neuro/Psych:  Normal mood and affect.  Skin:  Warm and dry.  Extremities: no clubbing, cyanosis, or edema.   Laboratory: Pending: CBC, type and screen, covid  Recent Labs  Lab  10/22/19 1925  WBC 8.5  HGB 12.4  HCT 37.2  PLT 260   Recent Labs  Lab 10/22/19 1925  NA 134*  K 3.9  CL 103  CO2 19*  BUN 11  CREATININE 0.75  CALCIUM 9.1  PROT 7.3  BILITOT 0.5  ALKPHOS 60  ALT 16  AST 17  GLUCOSE 93    Imaging No new imaging  Narrative & Impression  CLINICAL DATA:  Evaluate pregnancy of unknown location. Inappropriate elevation of beta HCG.  EXAM: TRANSVAGINAL OB ULTRASOUND  TECHNIQUE: Transvaginal ultrasound was performed for complete evaluation of the gestation as well as the maternal uterus, adnexal regions, and pelvic cul-de-sac.  COMPARISON:  10/16/2019.  FINDINGS: Intrauterine gestational sac: None  Yolk sac:  Not Visualized.  Embryo:  Not Visualized.  Maternal uterus/adnexae:  Right ovary: Appears normal  Left ovary: Appears normal containing corpus luteum  Other :Within the left adnexa there is a mass superior and medial to the left ovary which measures 1.6 x 1.6 x 1.5 cm. This appears to contain a gestational sac and small yolk sac but no fetal pole.Thickened and heterogeneous endometrium measures up to 1.3 cm  Free fluid:  No free fluid identified  IMPRESSION: 1. Mass within the left adnexa, separate from the ovary is noted which contains a small gestational sac and yolk sac but no fetal pole. Findings are consistent with ectopic pregnancy. 2. Critical Value/emergent results were called by telephone at the time of interpretation on 10/22/2019 at 6:58 pm to provider [facility] , who verbally acknowledged these results.   Electronically Signed   By: Signa Kell M.D.   On: 10/22/2019 18:59       Assessment: Ms. Prevost is a 29 y.o. G2P1001 with left ectopic. Pt stable  Plan: D/w her and partner that given s/s she has failed methotrexate therapy and recommend surgery with laparoscopic left salpingectomy. Plan for d/c to home from pacu. They are amenable to plan  D/w OR and they can start  after swab is back.    Total time taking care of the patient was 20 minutes, with greater than 50% of the time spent in face to face interaction with the patient.  Cornelia Copa MD Attending Center for [facility] GYN Consult Phone: [phone] (M-F, [phone]) & [phone] (Off hours, weekends, holidays)

## 2019-10-25 NOTE — Discharge Instructions (Addendum)
Laparoscopic Surgery Discharge Instructions  You have just undergone a laparoscopic surgery.  The following list should answer your most common questions.  Although we will discuss your surgery and post-operative instructions with you prior to your discharge, this list will serve as a reminder if you fail to recall the details of what we discussed.  We will discuss your surgery once again in detail at your post-op visit in two to four weeks. If you haven't already done so, please call to make your appointment as soon as possible.  How you will feel: Although you have just undergone a major surgery, your recovery will be significantly shorter since the surgery was performed through much smaller incisions than the traditional approach.  You should feel slightly better each day.  If you suddenly feel much worse than the prior day, please call the clinic.  It's important during the early part of your recovery that you maintain some activity.  Walking is encouraged.  You will quicken your recovery by continued activity.  Incision:  Your incisions will be closed with dissolvable stitches or surgical adhesive (glue).  There may be Band-aids and/or Steri-strips covering your incisions.  If there is no drainage from the incisions you may remove the Band-aids in one to two days.  You may notice some minor bruising at the incision sites.  This is common and will resolve within several days.  Please inform us if the redness at the edges of your incision appears to be spreading.  If the skin around your incision becomes warm to the touch, or if you notice a pus-like drainage, please call the office.  Stairs/Driving/Activities: You may climb stairs if necessary.  If you've had general anesthesia, do not drive a car the rest of the day today.  You may begin light housework when you feel up to it, but avoid heavy lifting (more than 15-20lbs) or pushing until cleared for these activities by your physician.  Hygiene:  Do  not soak your incisions.  Showers are acceptable but you may not take a bath or swim in a pool.  Cleanse your incisions daily with soap and water.  Medications:  Please resume taking any medications that you were taking prior to the surgery.  If we have prescribed any new medications for you, please take them as directed.  Constipation:  It is fairly common to experience some difficulty in moving your bowels following major surgery.  Being active will help to reduce this likelihood. A diet rich in fiber and plenty of liquids is desirable.  If you do become constipated, a mild laxative such as Miralax, Milk of Magnesia, or Metamucil, or a stool softener such as Colace, is recommended.  General Instructions: If you develop a fever of 100.5 degrees or higher, please call the office number(s) below for physician on call.   General Anesthesia, Adult, Care After This sheet gives you information about how to care for yourself after your procedure. Your health care provider may also give you more specific instructions. If you have problems or questions, contact your health care provider. What can I expect after the procedure? After the procedure, the following side effects are common:  Pain or discomfort at the IV site.  Nausea.  Vomiting.  Sore throat.  Trouble concentrating.  Feeling cold or chills.  Weak or tired.  Sleepiness and fatigue.  Soreness and body aches. These side effects can affect parts of the body that were not involved in surgery. Follow these instructions at home:  For at least 24 hours after the procedure:  Have a responsible adult stay with you. It is important to have someone help care for you until you are awake and alert.  Rest as needed.  Do not: ? Participate in activities in which you could fall or become injured. ? Drive. ? Use heavy machinery. ? Drink alcohol. ? Take sleeping pills or medicines that cause drowsiness. ? Make important decisions or  sign legal documents. ? Take care of children on your own. Eating and drinking  Follow any instructions from your health care provider about eating or drinking restrictions.  When you feel hungry, start by eating small amounts of foods that are soft and easy to digest (bland), such as toast. Gradually return to your regular diet.  Drink enough fluid to keep your urine pale yellow.  If you vomit, rehydrate by drinking water, juice, or clear broth. General instructions  If you have sleep apnea, surgery and certain medicines can increase your risk for breathing problems. Follow instructions from your health care provider about wearing your sleep device: ? Anytime you are sleeping, including during daytime naps. ? While taking prescription pain medicines, sleeping medicines, or medicines that make you drowsy.  Return to your normal activities as told by your health care provider. Ask your health care provider what activities are safe for you.  Take over-the-counter and prescription medicines only as told by your health care provider.  If you smoke, do not smoke without supervision.  Keep all follow-up visits as told by your health care provider. This is important. Contact a health care provider if:  You have nausea or vomiting that does not get better with medicine.  You cannot eat or drink without vomiting.  You have pain that does not get better with medicine.  You are unable to pass urine.  You develop a skin rash.  You have a fever.  You have redness around your IV site that gets worse. Get help right away if:  You have difficulty breathing.  You have chest pain.  You have blood in your urine or stool, or you vomit blood. Summary  After the procedure, it is common to have a sore throat or nausea. It is also common to feel tired.  Have a responsible adult stay with you for the first 24 hours after general anesthesia. It is important to have someone help care for you  until you are awake and alert.  When you feel hungry, start by eating small amounts of foods that are soft and easy to digest (bland), such as toast. Gradually return to your regular diet.  Drink enough fluid to keep your urine pale yellow.  Return to your normal activities as told by your health care provider. Ask your health care provider what activities are safe for you. This information is not intended to replace advice given to you by your health care provider. Make sure you discuss any questions you have with your health care provider. Document Revised: 01/26/2017 Document Reviewed: 09/08/2016 Elsevier Patient Education  2020 [employer].

## 2019-10-26 NOTE — Anesthesia Postprocedure Evaluation (Signed)
Anesthesia Post Note  Patient: Laura Rosario  Procedure(s) Performed: DIAGNOSTIC LAPAROSCOPY WITH REMOVAL OF ECTOPIC PREGNANCY (Left )     Patient location during evaluation: PACU Anesthesia Type: General Level of consciousness: sedated and patient cooperative Pain management: pain level controlled Vital Signs Assessment: post-procedure vital signs reviewed and stable Respiratory status: spontaneous breathing Cardiovascular status: stable Anesthetic complications: no   No complications documented.  Last Vitals:  Vitals:   10/25/19 1615 10/25/19 1630  BP: 117/73 112/75  Pulse: 88 79  Resp: 20 18  Temp:  36.7 C  SpO2: 100% 99%    Last Pain:  Vitals:   10/25/19 1630  TempSrc:   PainSc: 0-No pain                 Lewie Loron

## 2019-10-27 ENCOUNTER — Encounter (HOSPITAL_COMMUNITY): Payer: Self-pay | Admitting: Obstetrics and Gynecology

## 2019-10-28 LAB — SURGICAL PATHOLOGY

## 2019-11-03 ENCOUNTER — Telehealth: Payer: Self-pay | Admitting: Obstetrics & Gynecology

## 2019-11-03 NOTE — Telephone Encounter (Signed)
Patient Called stating that she would like a letter from Dr. Despina Hidden stating that she is able to be release to go back to work. Please contact pt when it is ready for pick up.

## 2019-11-04 ENCOUNTER — Encounter: Payer: Self-pay | Admitting: Obstetrics & Gynecology

## 2019-11-04 NOTE — Telephone Encounter (Signed)
Telephoned patient at home number and advised patient letter was in MyChart and could return to work 9/29. Patient voiced understanding.

## 2019-11-04 NOTE — Telephone Encounter (Signed)
I put a letter in her MYChart that she can access and return to work tomorrow?  Is this OK?

## 2019-11-11 ENCOUNTER — Ambulatory Visit (INDEPENDENT_AMBULATORY_CARE_PROVIDER_SITE_OTHER): Payer: PRIVATE HEALTH INSURANCE | Admitting: Obstetrics & Gynecology

## 2019-11-11 ENCOUNTER — Encounter: Payer: Self-pay | Admitting: *Deleted

## 2019-11-11 VITALS — BP 126/81 | HR 71 | Ht 62.0 in | Wt 188.0 lb

## 2019-11-11 DIAGNOSIS — Z9889 Other specified postprocedural states: Secondary | ICD-10-CM

## 2019-11-11 NOTE — Progress Notes (Signed)
  [id number]  Patient returns for routine postoperative follow-up having undergone laparoscopic left salpingectomy on 10/25/19.  The patient's immediate postoperative recovery has been unremarkable. Since hospital discharge the patient reports no problems healing well.   Current Outpatient Medications: levothyroxine (SYNTHROID) 150 MCG tablet, Take 1 tablet (150 mcg total) by mouth daily before breakfast., Disp: 30 tablet, Rfl: 3 cyclobenzaprine (FLEXERIL) 10 MG tablet, Take 1 tablet (10 mg total) by mouth 3 (three) times daily as needed. (Patient not taking: Reported on 11/11/2019), Disp: 30 tablet, Rfl: 0 docusate sodium (COLACE) 100 MG capsule, Take 1 capsule (100 mg total) by mouth 2 (two) times daily as needed. (Patient not taking: Reported on 11/11/2019), Disp: 30 capsule, Rfl: 2 oxyCODONE (ROXICODONE) 5 MG immediate release tablet, Take 1 tablet (5 mg total) by mouth every 4 (four) hours as needed for severe pain. (Patient not taking: Reported on 11/11/2019), Disp: 12 tablet, Rfl: 0  No current facility-administered medications for this visit.    Blood pressure 126/81, pulse 71, height 5\' 2"  (1.575 m), weight 188 lb (85.3 kg), unknown if currently breastfeeding.  Physical Exam: 3 incision sites all look good  Diagnostic Tests:   Pathology: Ruptured ectopic pregnancy  Impression: S/p laparoscopic removal of ectopic and left salpoingectomy  Plan:   Follow up: prn    , MD

## 2020-03-11 ENCOUNTER — Telehealth: Payer: Self-pay | Admitting: Obstetrics & Gynecology

## 2020-03-11 DIAGNOSIS — E038 Other specified hypothyroidism: Secondary | ICD-10-CM

## 2020-03-11 NOTE — Telephone Encounter (Signed)
Patient called and stated that Dr. Despina Hidden told her to call to schedule labs for her Thyroids( didn't see any orders). Clinical staff will follow up with patient.

## 2020-03-17 LAB — TSH: TSH: 0.225 u[IU]/mL — ABNORMAL LOW (ref 0.450–4.500)

## 2020-03-25 ENCOUNTER — Other Ambulatory Visit: Payer: Self-pay | Admitting: Obstetrics & Gynecology

## 2020-08-19 ENCOUNTER — Other Ambulatory Visit: Payer: Self-pay

## 2020-08-19 MED ORDER — METRONIDAZOLE 0.75 % VA GEL: VAGINAL | 0 refills | Status: AC

## 2020-09-20 ENCOUNTER — Telehealth: Payer: Self-pay | Admitting: Obstetrics & Gynecology

## 2020-09-20 ENCOUNTER — Other Ambulatory Visit: Payer: Self-pay

## 2020-09-20 MED ORDER — LEVOTHYROXINE SODIUM 150 MCG PO TABS: ORAL_TABLET | ORAL | 3 refills | Status: DC

## 2020-09-20 NOTE — Telephone Encounter (Signed)
Pt needs a refill on her levothyroxine until her appt here 10/14/2020 w/Dr. Despina Hidden  Please advise & notify pt    [facility]

## 2020-09-24 ENCOUNTER — Other Ambulatory Visit: Payer: Self-pay | Admitting: Obstetrics & Gynecology

## 2020-10-14 ENCOUNTER — Ambulatory Visit (INDEPENDENT_AMBULATORY_CARE_PROVIDER_SITE_OTHER): Payer: No Typology Code available for payment source | Admitting: Obstetrics & Gynecology

## 2020-10-14 ENCOUNTER — Other Ambulatory Visit: Payer: Self-pay

## 2020-10-14 ENCOUNTER — Encounter: Payer: Self-pay | Admitting: Obstetrics & Gynecology

## 2020-10-14 VITALS — BP 122/84 | HR 73 | Ht 62.0 in | Wt 176.0 lb

## 2020-10-14 DIAGNOSIS — E038 Other specified hypothyroidism: Secondary | ICD-10-CM

## 2020-10-14 DIAGNOSIS — Z3202 Encounter for pregnancy test, result negative: Secondary | ICD-10-CM

## 2020-10-14 LAB — POCT URINE PREGNANCY: Preg Test, Ur: NEGATIVE

## 2020-10-14 NOTE — Progress Notes (Signed)
Chief Complaint  Patient presents with   needs refill on thyroid med      30 y.o. G2P1011 Patient's last menstrual period was 09/14/2020. The current method of family planning is none.  Outpatient Encounter Medications as of 10/14/2020  Medication Sig   levothyroxine (SYNTHROID) 150 MCG tablet TAKE 1 TABLET(150 MCG) BY MOUTH DAILY BEFORE BREAKFAST   metroNIDAZOLE (METROGEL VAGINAL) 0.75 % vaginal gel Nightly x 5 nights (Patient taking differently: as needed.)   [DISCONTINUED] cyclobenzaprine (FLEXERIL) 10 MG tablet Take 1 tablet (10 mg total) by mouth 3 (three) times daily as needed. (Patient not taking: Reported on 11/11/2019)   [DISCONTINUED] docusate sodium (COLACE) 100 MG capsule Take 1 capsule (100 mg total) by mouth 2 (two) times daily as needed. (Patient not taking: Reported on 11/11/2019)   [DISCONTINUED] oxyCODONE (ROXICODONE) 5 MG immediate release tablet Take 1 tablet (5 mg total) by mouth every 4 (four) hours as needed for severe pain. (Patient not taking: Reported on 11/11/2019)   No facility-administered encounter medications on file as of 10/14/2020.    Subjective Pt here for refill on thyroid hormone Most recent 2/22 was just a bit hyperthyroid but she is doing well on that dosing Trying to get pregnant using OPK,  Past Medical History:  Diagnosis Date   Abnormal Pap smear    HPV   Asthma    Contraceptive management 08/22/2013   Herpes simplex without mention of complication    Vaginal Pap smear, abnormal     Past Surgical History:  Procedure Laterality Date   CESAREAN SECTION     DIAGNOSTIC LAPAROSCOPY WITH REMOVAL OF ECTOPIC PREGNANCY Left 10/25/2019   Procedure: DIAGNOSTIC LAPAROSCOPY WITH REMOVAL OF ECTOPIC PREGNANCY;  Surgeon: [place] Bing, MD;  Location: [facility] OR;  Service: Gynecology;  Laterality: Left;    OB History     Gravida  2   Para  1   Term  1   Preterm      AB  1   Living  1      SAB      IAB      Ectopic  1   Multiple       Live Births              No Known Allergies  Social History   Socioeconomic History   Marital status: Legally Separated    Spouse name: Not on file   Number of children: Not on file   Years of education: Not on file   Highest education level: Not on file  Occupational History   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[employer]   Tobacco Use   Smoking status: Former    Packs/day: 0.02    Years: 2.00    Pack years: 0.04    Types: Cigarettes    Quit date: 05/02/2010    Years since quitting: 10.4   Smokeless tobacco: Never  Vaping Use   Vaping Use: Never used  Substance and Sexual Activity   Alcohol use: Not Currently    Alcohol/week: 1.0 standard drink    Types: 1 Glasses of wine per week    Comment: ocassional 2time a month   Drug use: Not Currently    Types: Marijuana    Comment: late august 2021   Sexual activity: Yes    Birth control/protection: None  Other Topics Concern   Not on file  Social History Narrative   Not on file   Social Determinants of Corporate investment banker  Strain: Not on file  Food Insecurity: Not on file  Transportation Needs: Not on file  Physical Activity: Not on file  Stress: Not on file  Social Connections: Not on file    Family History  Problem Relation Age of Onset   Cancer Father        Lung   Hypertension Father    Iron deficiency Mother    Thyroid disease Mother    Diabetes Paternal Grandmother    Heart attack Maternal Grandfather    Diabetes Paternal Grandfather     Medications:       Current Outpatient Medications:    levothyroxine (SYNTHROID) 150 MCG tablet, TAKE 1 TABLET(150 MCG) BY MOUTH DAILY BEFORE BREAKFAST, Disp: 30 tablet, Rfl: 3   metroNIDAZOLE (METROGEL VAGINAL) 0.75 % vaginal gel, Nightly x 5 nights (Patient taking differently: as needed.), Disp: 70 g, Rfl: 0  Objective Blood pressure 122/84, pulse 73, height 5\' 2"  (1.575 m), weight 176 lb (79.8 kg), last menstrual period 09/14/2020, not currently  breastfeeding.  Gen WDWN NAD  Pertinent ROS No burning with urination, frequency or urgency No nausea, vomiting or diarrhea Nor fever chills or other constitutional symptoms   Labs or studies TSH pending    Impression Diagnoses this Encounter::   ICD-10-CM   1. Other specified hypothyroidism  E03.8 TSH    2. Pregnancy examination or test, negative result  Z32.02 POCT urine pregnancy      Established relevant diagnosis(es):   Plan/Recommendations: No orders of the defined types were placed in this encounter.   Labs or Scans Ordered: Orders Placed This Encounter  Procedures   TSH   POCT urine pregnancy    Management:: Check TSH and refill synthroid based on labe value  Follow up Return if symptoms worsen or fail to improve.      All questions were answered.

## 2020-10-15 ENCOUNTER — Other Ambulatory Visit: Payer: Self-pay | Admitting: Obstetrics & Gynecology

## 2020-10-15 LAB — TSH: TSH: 0.089 u[IU]/mL — ABNORMAL LOW (ref 0.450–4.500)

## 2020-10-15 MED ORDER — LEVOTHYROXINE SODIUM 137 MCG PO TABS
ORAL_TABLET | ORAL | 11 refills | Status: DC
Start: 2020-10-15 — End: 2021-11-09

## 2021-06-09 ENCOUNTER — Telehealth: Payer: No Typology Code available for payment source | Admitting: Family

## 2021-06-09 DIAGNOSIS — R399 Unspecified symptoms and signs involving the genitourinary system: Secondary | ICD-10-CM | POA: Diagnosis not present

## 2021-06-10 MED ORDER — CEPHALEXIN 500 MG PO CAPS: 500.0000 mg | ORAL_CAPSULE | Freq: Two times a day (BID) | ORAL | 0 refills | Status: AC

## 2021-06-10 NOTE — Progress Notes (Signed)

## 2021-07-18 ENCOUNTER — Ambulatory Visit (INDEPENDENT_AMBULATORY_CARE_PROVIDER_SITE_OTHER): Payer: PRIVATE HEALTH INSURANCE | Admitting: Obstetrics & Gynecology

## 2021-07-18 ENCOUNTER — Encounter: Payer: Self-pay | Admitting: Obstetrics & Gynecology

## 2021-07-18 ENCOUNTER — Other Ambulatory Visit: Payer: Self-pay | Admitting: Obstetrics & Gynecology

## 2021-07-18 VITALS — BP 121/79 | HR 91 | Ht 62.0 in | Wt 166.0 lb

## 2021-07-18 DIAGNOSIS — E038 Other specified hypothyroidism: Secondary | ICD-10-CM | POA: Diagnosis not present

## 2021-07-18 DIAGNOSIS — N6011 Diffuse cystic mastopathy of right breast: Secondary | ICD-10-CM

## 2021-07-18 NOTE — Progress Notes (Signed)
Chief Complaint  Patient presents with   Breast Problem      31 y.o. G2P1011 Patient's last menstrual period was 06/22/2021. The current method of family planning is none.  Outpatient Encounter Medications as of 07/18/2021  Medication Sig   levothyroxine (SYNTHROID) 137 MCG tablet 1 tablet daily, 30 minutes before breakfast   cephALEXin (KEFLEX) 500 MG capsule Take 1 capsule (500 mg total) by mouth 2 (two) times daily. (Patient not taking: Reported on 07/18/2021)   metroNIDAZOLE (METROGEL VAGINAL) 0.75 % vaginal gel Nightly x 5 nights (Patient not taking: Reported on 07/18/2021)   No facility-administered encounter medications on file as of 07/18/2021.    Subjective Pt with area right breast discovered yesterday She is within 1 week of her cycle Was seen in 2014 for similar based on records Also minimal amount of discharge Drinks 2 cups of coffee per day, no other caffeine Past Medical History:  Diagnosis Date   Abnormal Pap smear    HPV   Asthma    Contraceptive management 08/22/2013   Herpes simplex without mention of complication    Vaginal Pap smear, abnormal     Past Surgical History:  Procedure Laterality Date   CESAREAN SECTION     DIAGNOSTIC LAPAROSCOPY WITH REMOVAL OF ECTOPIC PREGNANCY Left 10/25/2019   Procedure: DIAGNOSTIC LAPAROSCOPY WITH REMOVAL OF ECTOPIC PREGNANCY;  Surgeon: [place] Bing, MD;  Location: [facility] OR;  Service: Gynecology;  Laterality: Left;    OB History     Gravida  2   Para  1   Term  1   Preterm      AB  1   Living  1      SAB      IAB      Ectopic  1   Multiple      Live Births              No Known Allergies  Social History   Socioeconomic History   Marital status: Legally Separated    Spouse name: Not on file   Number of children: Not on file   Years of education: Not on file   Highest education level: Not on file  Occupational History   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[place]   Tobacco Use   Smoking  status: Former    Packs/day: 0.02    Years: 2.00    Total pack years: 0.04    Types: Cigarettes    Quit date: 05/02/2010    Years since quitting: 11.2   Smokeless tobacco: Never  Vaping Use   Vaping Use: Never used  Substance and Sexual Activity   Alcohol use: Not Currently    Alcohol/week: 1.0 standard drink of alcohol    Types: 1 Glasses of wine per week    Comment: ocassional 2time a month   Drug use: Not Currently    Types: Marijuana    Comment: late august 2021   Sexual activity: Yes    Birth control/protection: None  Other Topics Concern   Not on file  Social History Narrative   Not on file   Social Determinants of Health   Financial Resource Strain: Not on file  Food Insecurity: Not on file  Transportation Needs: Not on file  Physical Activity: Insufficiently Active (09/29/2019)   Exercise Vital Sign    Days of Exercise per Week: 1 day    Minutes of Exercise per Session: 30 min  Stress: Not on file  Social Connections: Moderately Isolated (  09/29/2019)   Social Connection and Isolation Panel [NHANES]    Frequency of Communication with Friends and Family: More than three times a week    Frequency of Social Gatherings with Friends and Family: Three times a week    Attends Religious Services: Never    Active Member of Clubs or Organizations: No    Attends Banker Meetings: Never    Marital Status: Living with partner    Family History  Problem Relation Age of Onset   Cancer Father        Lung   Hypertension Father    Iron deficiency Mother    Thyroid disease Mother    Diabetes Paternal Grandmother    Heart attack Maternal Grandfather    Diabetes Paternal Grandfather     Medications:       Current Outpatient Medications:    levothyroxine (SYNTHROID) 137 MCG tablet, 1 tablet daily, 30 minutes before breakfast, Disp: 30 tablet, Rfl: 11   cephALEXin (KEFLEX) 500 MG capsule, Take 1 capsule (500 mg total) by mouth 2 (two) times daily. (Patient not  taking: Reported on 07/18/2021), Disp: 14 capsule, Rfl: 0   metroNIDAZOLE (METROGEL VAGINAL) 0.75 % vaginal gel, Nightly x 5 nights (Patient not taking: Reported on 07/18/2021), Disp: 70 g, Rfl: 0  Objective Blood pressure 121/79, pulse 91, height 5\' 2"  (1.575 m), weight 166 lb (75.3 kg), last menstrual period 06/22/2021.  Upper mid to outer quadrant with diffuse area of thickening consistent with fibrocystic changes No skin changes erythema or dimpling  Pertinent ROS No burning with urination, frequency or urgency No nausea, vomiting or diarrhea Nor fever chills or other constitutional symptoms   Labs or studies No new    Impression + Management Plan: Diagnoses this Encounter::   ICD-10-CM   1. Fibrocystic breast changes, right  N60.11 MS DIGITAL DIAG TOMO UNI RIGHT    2. Other specified hypothyroidism  E03.8 TSH   on levothyroxine 137 mics        Medications prescribed during  this encounter: No orders of the defined types were placed in this encounter.   Labs or Scans Ordered during this encounter: Orders Placed This Encounter  Procedures   MS DIGITAL DIAG TOMO UNI RIGHT   TSH      Follow up No follow-ups on file.

## 2021-07-19 LAB — TSH: TSH: 0.155 u[IU]/mL — ABNORMAL LOW (ref 0.450–4.500)

## 2021-07-25 IMAGING — US US OB TRANSVAGINAL
1 series · 15 of 28 positions shown · non-contrast
Comparison: 10/16/2019.

CLINICAL DATA: Evaluate pregnancy of unknown location.
Inappropriate elevation of beta HCG.

EXAM:
TRANSVAGINAL OB ULTRASOUND
TECHNIQUE: Transvaginal ultrasound was performed for complete evaluation of the
gestation as well as the maternal uterus, adnexal regions, and
pelvic cul-de-sac.

[Series 1: us ob transvaginal · 46 acquisitions, 15 frames shown]
[im 1/46]
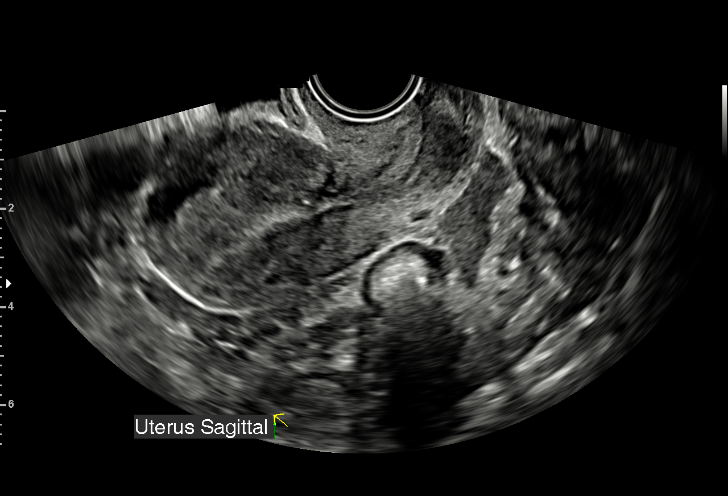
[im 4/46]
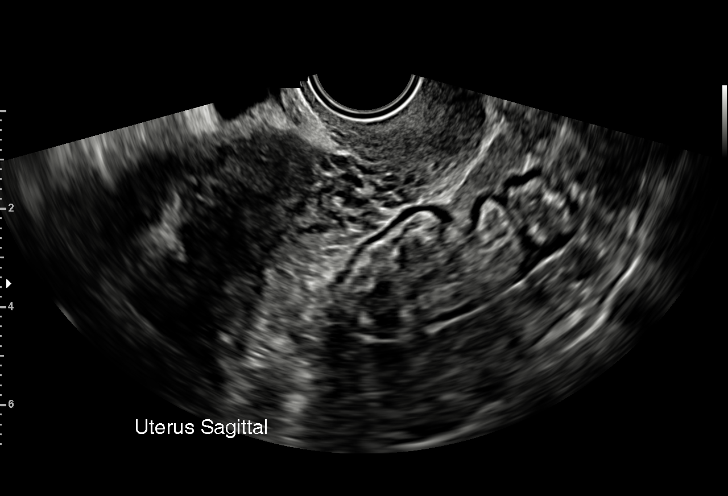
[im 7/46]
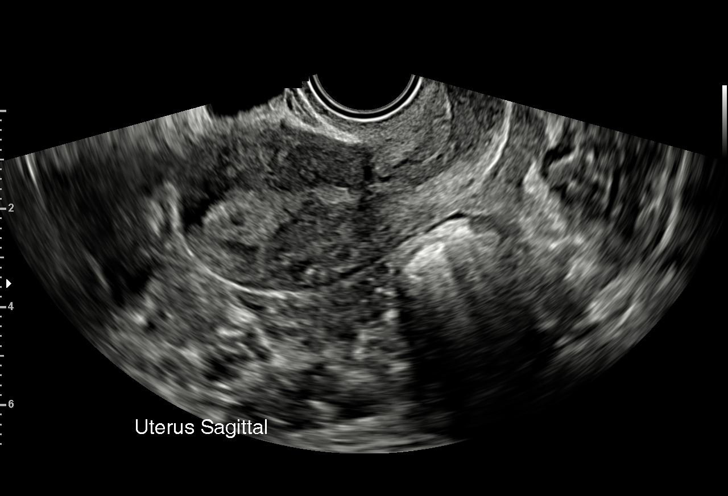
[im 11/46]
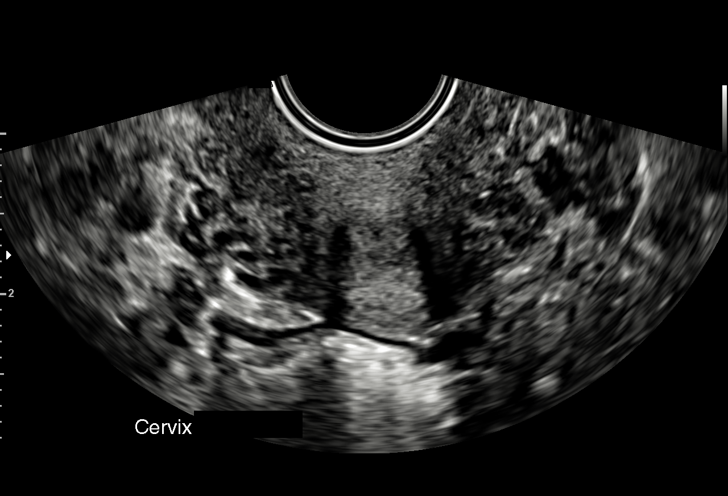
[im 14/46]
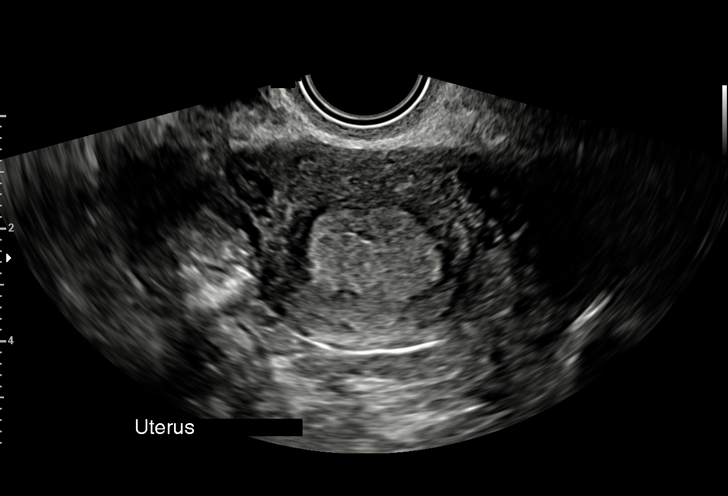
[im 17/46]
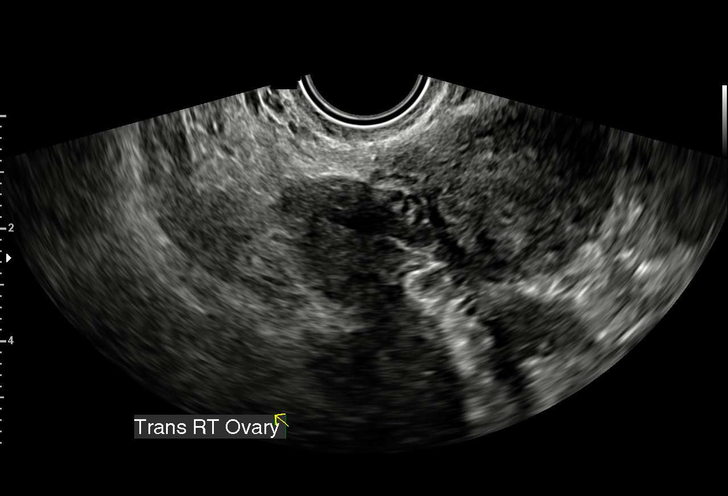
[im 21/46]
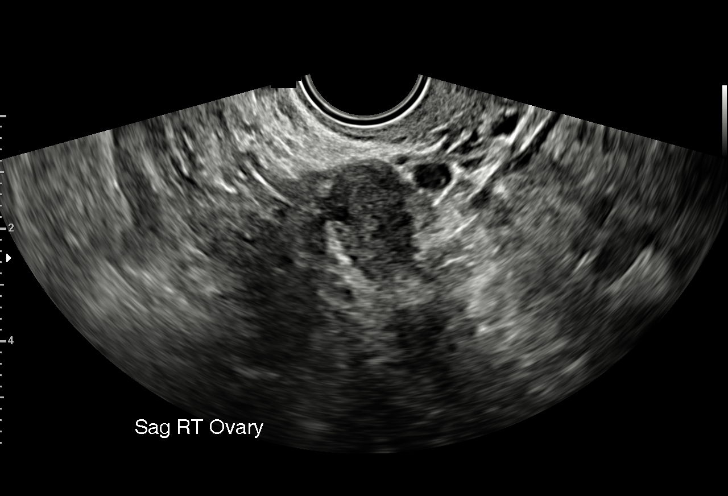
[im 24/46]
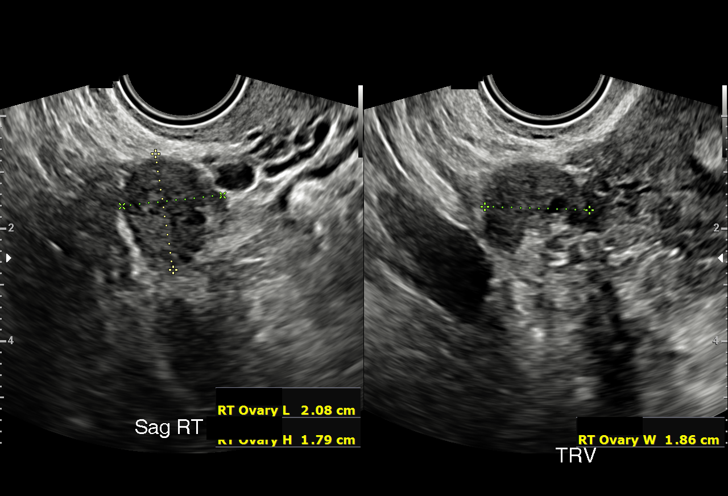
[im 26/46]
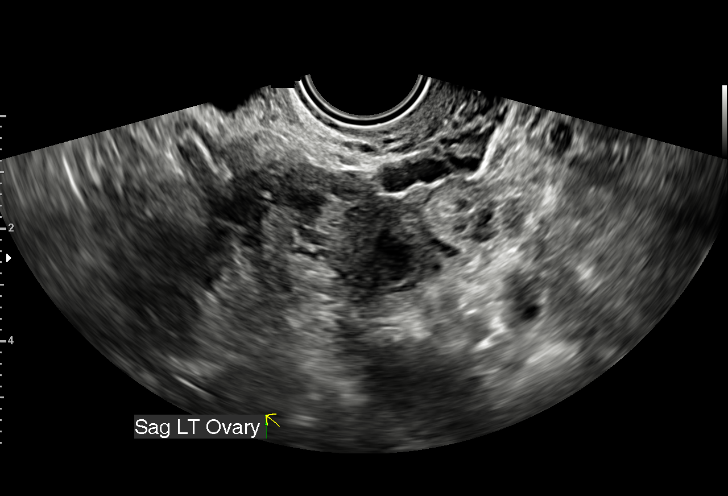
[im 29/46]
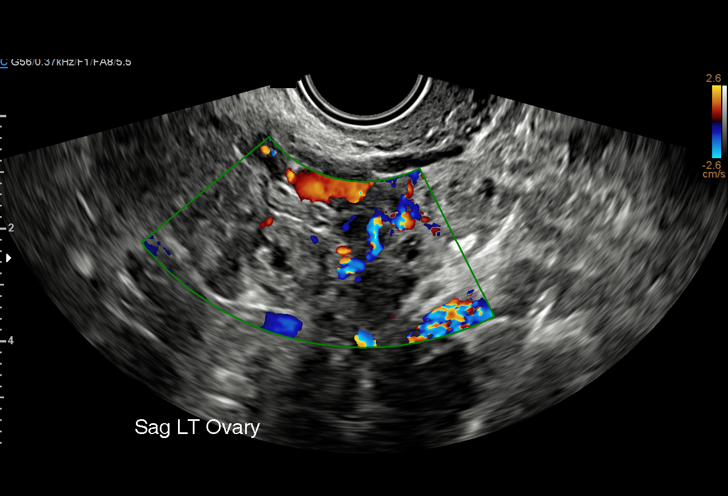
[im 32/46]
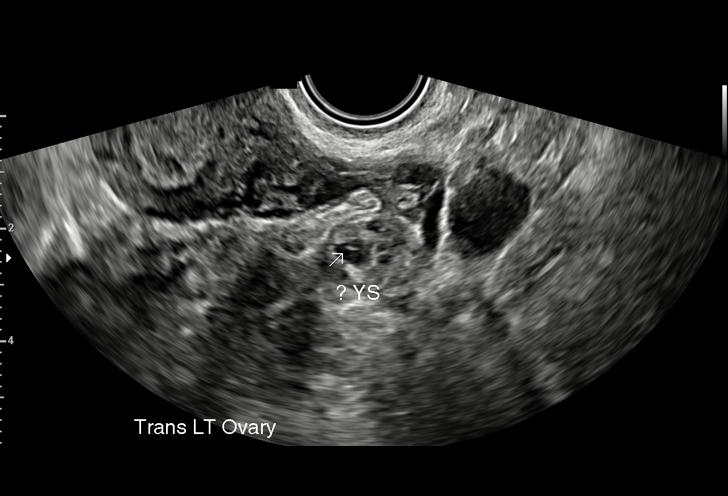
[im 36/46]
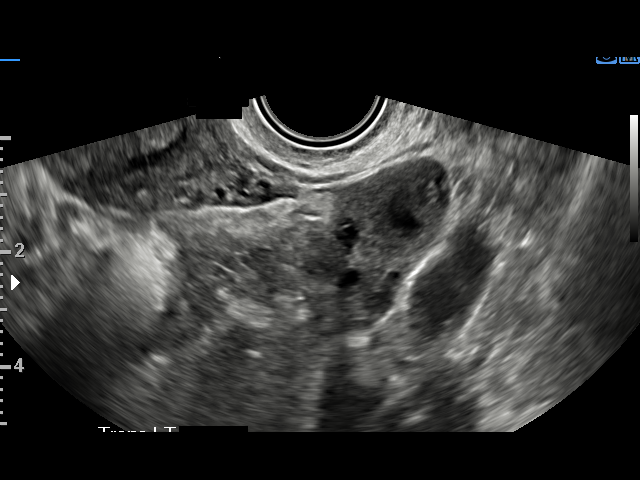
[im 39/46]
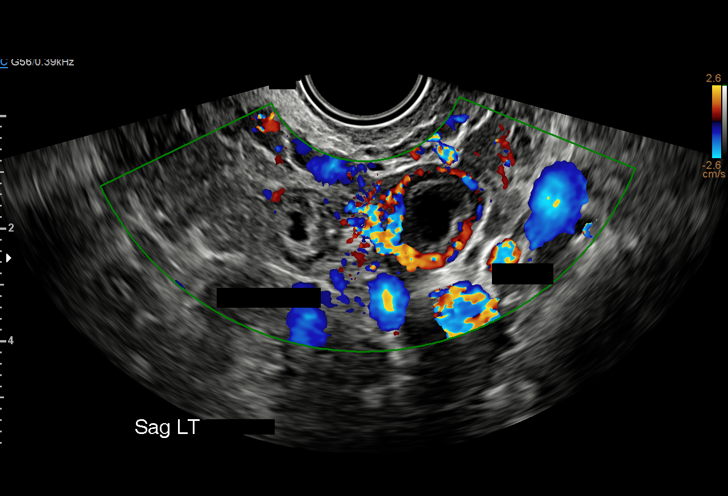
[im 42/46]
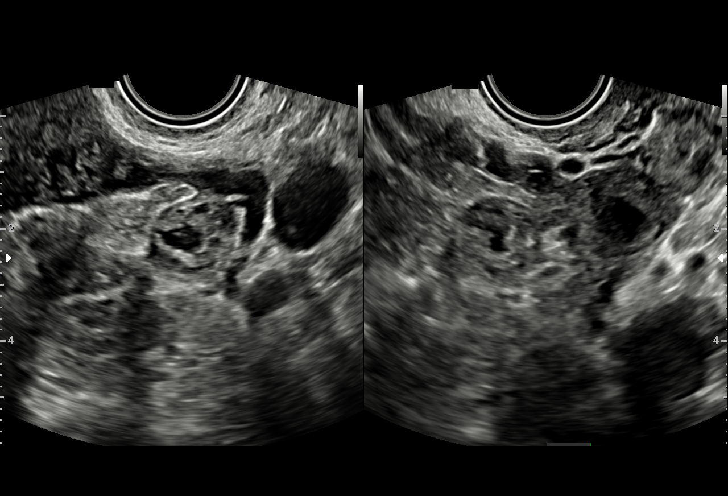
[im 46/46]
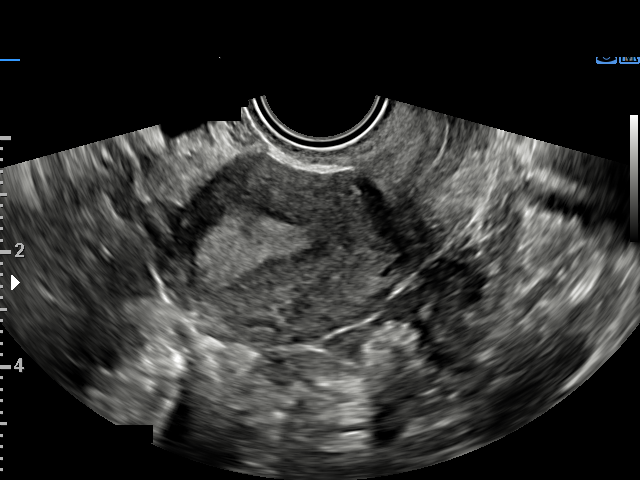

[15 of 28 positions shown; findings below may reference images not displayed]

FINDINGS: Intrauterine gestational sac: None

Yolk sac:  Not Visualized.

Embryo:  Not Visualized.

Maternal uterus/adnexae:

Right ovary: Appears normal

Left ovary: Appears normal containing corpus luteum

Other :Within the left adnexa there is a mass superior and medial to
the left ovary which measures 1.6 x 1.6 x 1.5 cm. This appears to
contain a gestational sac and small yolk sac but no fetal
pole.Thickened and heterogeneous endometrium measures up to 1.3 cm

Free fluid:  No free fluid identified
IMPRESSION: 1. Mass within the left adnexa, separate from the ovary is noted
which contains a small gestational sac and yolk sac but no fetal
pole. Findings are consistent with ectopic pregnancy.
2. Critical Value/emergent results were called by telephone at the
time of interpretation on 10/22/2019 at [DATE] to provider KHALID IBN
[email or web address]-MATTI , who verbally acknowledged these results.

## 2021-08-02 ENCOUNTER — Inpatient Hospital Stay (HOSPITAL_COMMUNITY): Admission: RE | Admit: 2021-08-02 | Payer: PRIVATE HEALTH INSURANCE | Source: Ambulatory Visit

## 2021-08-02 ENCOUNTER — Ambulatory Visit (HOSPITAL_COMMUNITY): Admission: RE | Admit: 2021-08-02 | Payer: PRIVATE HEALTH INSURANCE | Source: Ambulatory Visit

## 2021-08-02 ENCOUNTER — Ambulatory Visit (HOSPITAL_COMMUNITY): Payer: PRIVATE HEALTH INSURANCE

## 2021-08-18 ENCOUNTER — Ambulatory Visit (HOSPITAL_COMMUNITY): Payer: PRIVATE HEALTH INSURANCE

## 2021-08-18 ENCOUNTER — Inpatient Hospital Stay (HOSPITAL_COMMUNITY): Admission: RE | Admit: 2021-08-18 | Payer: PRIVATE HEALTH INSURANCE | Source: Ambulatory Visit

## 2021-08-18 ENCOUNTER — Ambulatory Visit (HOSPITAL_COMMUNITY): Admission: RE | Admit: 2021-08-18 | Payer: PRIVATE HEALTH INSURANCE | Source: Ambulatory Visit

## 2021-11-09 ENCOUNTER — Other Ambulatory Visit: Payer: Self-pay | Admitting: Obstetrics & Gynecology
